# Patient Record
Sex: Female | Born: 1965 | Race: White | Hispanic: No | Smoking: Former smoker
Health system: Southern US, Community
[De-identification: ages and names within clinical notes are randomized; demographics above are authoritative.]

## PROBLEM LIST (undated history)

## (undated) ENCOUNTER — Ambulatory Visit (HOSPITAL_COMMUNITY): Admission: EM | Payer: BC Managed Care – PPO | Source: Home / Self Care

## (undated) DIAGNOSIS — N200 Calculus of kidney: Secondary | ICD-10-CM

## (undated) HISTORY — PX: ABLATION: SHX5711

## (undated) HISTORY — PX: CHOLECYSTECTOMY: SHX55

## (undated) HISTORY — DX: Calculus of kidney: N20.0

## (undated) HISTORY — PX: OTHER SURGICAL HISTORY: SHX169

## (undated) HISTORY — PX: KIDNEY STONE SURGERY: SHX686

---

## 2018-08-07 ENCOUNTER — Other Ambulatory Visit (HOSPITAL_COMMUNITY)
Admission: RE | Admit: 2018-08-07 | Discharge: 2018-08-07 | Disposition: A | Discharge status: Home / Self Care | Payer: BC Managed Care – PPO | Source: Ambulatory Visit | Attending: Obstetrics and Gynecology | Admitting: Obstetrics and Gynecology

## 2018-08-07 ENCOUNTER — Other Ambulatory Visit: Payer: Self-pay

## 2018-08-07 ENCOUNTER — Ambulatory Visit (INDEPENDENT_AMBULATORY_CARE_PROVIDER_SITE_OTHER): Payer: BC Managed Care – PPO | Admitting: Obstetrics and Gynecology

## 2018-08-07 ENCOUNTER — Encounter: Payer: Self-pay | Admitting: Obstetrics and Gynecology

## 2018-08-07 VITALS — BP 110/70 | Ht 65.0 in | Wt 155.6 lb

## 2018-08-07 DIAGNOSIS — Z124 Encounter for screening for malignant neoplasm of cervix: Secondary | ICD-10-CM | POA: Diagnosis present

## 2018-08-07 DIAGNOSIS — N841 Polyp of cervix uteri: Secondary | ICD-10-CM

## 2018-08-07 DIAGNOSIS — N200 Calculus of kidney: Secondary | ICD-10-CM

## 2018-08-07 NOTE — Progress Notes (Signed)
   Patient ID: Amy Cameron, female   DOB: 07-Apr-1965, 53 y.o.   MRN: [id number]  Reason for Consult: Gynecologic Exam (pt says she is here for pap mainly, hasnt had one in 3 yrs)   Referred by Hughie Closs, PA-C  Subjective:     HPI:  Amy Cameron is a 53 y.o. female   Gynecological History  No LMP recorded. Patient has had an ablation.   History of fibroids, polyps, or ovarian cysts? : no  History of PCOS? no Hstory of Endometriosis? no History of abnormal pap smears? no Have you had any sexually transmitted infections in the past? no    History reviewed. No pertinent past medical history. Family History  Problem Relation Age of Onset  . Breast cancer Mother 19       precancerous, removed both breasts  . Diabetes Mother   . Hypertension Mother   . Other Mother        cholesterol  . Kidney cancer Father   . Diabetes Father   . Hypertension Father   . Other Father        cholesterol   Past Surgical History:  Procedure Laterality Date  . ABLATION    . CHOLECYSTECTOMY    . EXTRACORPOREAL SHOCK WAVE LITHOTRIPSY Right 08/10/2018   Procedure: EXTRACORPOREAL SHOCK WAVE LITHOTRIPSY (ESWL);  Surgeon: Billey Co, MD;  Location: [facility] ORS;  Service: Urology;  Laterality: Right;  . KIDNEY STONE SURGERY    . OTHER SURGICAL HISTORY     both shoulders    Short Social History:  Social History   Tobacco Use  . Smoking status: Former Research scientist (life sciences)  . Smokeless tobacco: Never Used  Substance Use Topics  . Alcohol use: Not Currently    Allergies  Allergen Reactions  . Naproxen Nausea And Vomiting and Other (See Comments)    Acute Kidney Injury Acute Kidney Injury   . Sulfa Antibiotics Hives    No current outpatient medications on file.   No current facility-administered medications for this visit.    REVIEW OF SYSTEMS      Objective:  Objective   Vitals:   08/07/18 1429  BP: 110/70  Weight: 155 lb 9.6 oz (70.6 kg)  Height: 5\' 5"  (1.651 m)    Body mass index is 25.89 kg/m.  Physical Exam  Assessment/Plan:    53 yo Small cervical polyp removed Pap smear today Mammogram scheduled for later this week Declines STD screening  [place], [facility] 08/07/2018 4:33 PM

## 2018-08-08 ENCOUNTER — Ambulatory Visit
Admission: RE | Admit: 2018-08-08 | Discharge: 2018-08-08 | Disposition: A | Discharge status: Home / Self Care | Payer: BC Managed Care – PPO | Attending: Urology | Admitting: Urology

## 2018-08-08 ENCOUNTER — Ambulatory Visit
Admission: RE | Admit: 2018-08-08 | Discharge: 2018-08-08 | Disposition: A | Discharge status: Home / Self Care | Payer: BC Managed Care – PPO | Source: Ambulatory Visit | Attending: Urology | Admitting: Urology

## 2018-08-08 ENCOUNTER — Other Ambulatory Visit
Admission: RE | Admit: 2018-08-08 | Discharge: 2018-08-08 | Disposition: A | Discharge status: Home / Self Care | Payer: BC Managed Care – PPO | Source: Home / Self Care | Attending: Urology | Admitting: Urology

## 2018-08-08 ENCOUNTER — Ambulatory Visit (INDEPENDENT_AMBULATORY_CARE_PROVIDER_SITE_OTHER): Payer: BC Managed Care – PPO | Admitting: Urology

## 2018-08-08 ENCOUNTER — Encounter: Payer: Self-pay | Admitting: Urology

## 2018-08-08 VITALS — BP 115/83 | HR 64 | Ht 65.0 in | Wt 155.0 lb

## 2018-08-08 DIAGNOSIS — N200 Calculus of kidney: Secondary | ICD-10-CM

## 2018-08-08 LAB — URINALYSIS, COMPLETE (UACMP) WITH MICROSCOPIC
Bilirubin Urine: NEGATIVE
Glucose, UA: NEGATIVE mg/dL
Hgb urine dipstick: NEGATIVE
Ketones, ur: NEGATIVE mg/dL
Nitrite: NEGATIVE
Protein, ur: NEGATIVE mg/dL
RBC / HPF: NONE SEEN RBC/hpf (ref 0–5)
Specific Gravity, Urine: 1.02 (ref 1.005–1.030)
Squamous Epithelial / HPF: NONE SEEN (ref 0–5)
pH: 8 (ref 5.0–8.0)

## 2018-08-08 NOTE — Patient Instructions (Signed)
1. Start litholyte packets 2-3 daily for stone prevention   Dietary Guidelines to Help Prevent Kidney Stones Kidney stones are deposits of minerals and salts that form inside your kidneys. Your risk of developing kidney stones may be greater depending on your diet, your lifestyle, the medicines you take, and whether you have certain medical conditions. Most people can reduce their chances of developing kidney stones by following the instructions below. Depending on your overall health and the type of kidney stones you tend to develop, your dietitian may give you more specific instructions. What are tips for following this plan? Reading food labels  Choose foods with "no salt added" or "low-salt" labels. Limit your sodium intake to less than 1500 mg per day.  Choose foods with calcium for each meal and snack. Try to eat about 300 mg of calcium at each meal. Foods that contain 200-500 mg of calcium per serving include: ? 8 oz (237 ml) of milk, fortified nondairy milk, and fortified fruit juice. ? 8 oz (237 ml) of kefir, yogurt, and soy yogurt. ? 4 oz (118 ml) of tofu. ? 1 oz of cheese. ? 1 cup (300 g) of dried figs. ? 1 cup (91 g) of cooked broccoli. ? 1-3 oz can of sardines or mackerel.  Most people need 1000 to 1500 mg of calcium each day. Talk to your dietitian about how much calcium is recommended for you. Shopping  Buy plenty of fresh fruits and vegetables. Most people do not need to avoid fruits and vegetables, even if they contain nutrients that may contribute to kidney stones.  When shopping for convenience foods, choose: ? Whole pieces of fruit. ? Premade salads with dressing on the side. ? Low-fat fruit and yogurt smoothies.  Avoid buying frozen meals or prepared deli foods.  Look for foods with live cultures, such as yogurt and kefir. Cooking  Do not add salt to food when cooking. Place a salt shaker on the table and allow each person to add his or her own salt to taste.   Use vegetable protein, such as beans, textured vegetable protein (TVP), or tofu instead of meat in pasta, casseroles, and soups. Meal planning   Eat less salt, if told by your dietitian. To do this: ? Avoid eating processed or premade food. ? Avoid eating fast food.  Eat less animal protein, including cheese, meat, poultry, or fish, if told by your dietitian. To do this: ? Limit the number of times you have meat, poultry, fish, or cheese each week. Eat a diet free of meat at least 2 days a week. ? Eat only one serving each day of meat, poultry, fish, or seafood. ? When you prepare animal protein, cut pieces into small portion sizes. For most meat and fish, one serving is about the size of one deck of cards.  Eat at least 5 servings of fresh fruits and vegetables each day. To do this: ? Keep fruits and vegetables on hand for snacks. ? Eat 1 piece of fruit or a handful of berries with breakfast. ? Have a salad and fruit at lunch. ? Have two kinds of vegetables at dinner.  Limit foods that are high in a substance called oxalate. These include: ? Spinach. ? Rhubarb. ? Beets. ? Potato chips and french fries. ? Nuts.  If you regularly take a diuretic medicine, make sure to eat at least 1-2 fruits or vegetables high in potassium each day. These include: ? Avocado. ? Banana. ? Orange, prune, carrot, or  tomato juice. ? Baked potato. ? Cabbage. ? Beans and split peas. General instructions   Drink enough fluid to keep your urine clear or pale yellow. This is the most important thing you can do.  Talk to your health care provider and dietitian about taking daily supplements. Depending on your health and the cause of your kidney stones, you may be advised: ? Not to take supplements with vitamin C. ? To take a calcium supplement. ? To take a daily probiotic supplement. ? To take other supplements such as magnesium, fish oil, or vitamin B6.  Take all medicines and supplements as told by  your health care provider.  Limit alcohol intake to no more than 1 drink a day for nonpregnant women and 2 drinks a day for men. One drink equals 12 oz of beer, 5 oz of wine, or 1 oz of hard liquor.  Lose weight if told by your health care provider. Work with your dietitian to find strategies and an eating plan that works best for you. What foods are not recommended? Limit your intake of the following foods, or as told by your dietitian. Talk to your dietitian about specific foods you should avoid based on the type of kidney stones and your overall health. Grains Breads. Bagels. Rolls. Baked goods. Salted crackers. Cereal. Pasta. Vegetables Spinach. Rhubarb. Beets. Canned vegetables. Rosita FirePickles. Olives. Meats and other protein foods Nuts. Nut butters. Large portions of meat, poultry, or fish. Salted or cured meats. Deli meats. Hot dogs. Sausages. Dairy Cheese. Beverages Regular soft drinks. Regular vegetable juice. Seasonings and other foods Seasoning blends with salt. Salad dressings. Canned soups. Soy sauce. Ketchup. Barbecue sauce. Canned pasta sauce. Casseroles. Pizza. Lasagna. Frozen meals. Potato chips. JamaicaFrench fries. Summary  You can reduce your risk of kidney stones by making changes to your diet.  The most important thing you can do is drink enough fluid. You should drink enough fluid to keep your urine clear or pale yellow.  Ask your health care provider or dietitian how much protein from animal sources you should eat each day, and also how much salt and calcium you should have each day. This information is not intended to replace advice given to you by your health care provider. Make sure you discuss any questions you have with your health care provider. Document Released: 05/15/2010 Document Revised: 05/10/2018 Document Reviewed: 12/30/2015 Elsevier Patient Education  2020 [employer]

## 2018-08-08 NOTE — Progress Notes (Addendum)
08/08/2018 1:25 PM   Amy Cameron November 04, 1965 [id number]  Referring provider: Nelwyn Salisbury, PA-C [address],  [place] [address]  CC: Recurrent nephrolithiasis  HPI: I saw Amy Cameron in urology clinic today in consultation for recurrent nephrolithiasis from [place], [place].  She is a very healthy 53 year old female who reportedly has an extensive history of nephrolithiasis over the last 8 years with 5 to 10 stones passed per year.  She recently moved to the area from [place].  She has undergone right ureteroscopy, laser lithotripsy, and stent previously for what sounds like an infected 7 mm right ureteral stone that needed to be pre-stented.  She did not tolerate her stent well.  She also was worked up with a 24-hour urine that was reportedly negative, aside from low urine volume.  She most recently passed a stone about 4 to 5 weeks ago, and brought to clinic today.  We will send for analysis.  She has occasional dull right-sided flank pain, but denies any symptoms of acute renal colic today.  She denies any gross hematuria, fevers, or chills.  She is unsure her stone type, but thinks it was calcium oxalate mixed with something else.  No prior bowel surgeries.  There are no aggravating or alleviating factors.  Severity is moderate.  She denies a family history of kidney stones.  There is no recent imaging to review regarding stone burden.   PMH: None   Surgical History: Past Surgical History:  Procedure Laterality Date  . ABLATION    . CHOLECYSTECTOMY    . KIDNEY STONE SURGERY    . OTHER SURGICAL HISTORY     both shoulders    Allergies:  Allergies  Allergen Reactions  . Naproxen Nausea And Vomiting and Other (See Comments)    Acute Kidney Injury Acute Kidney Injury   . Sulfa Antibiotics Hives    Family History: Family History  Problem Relation Age of Onset  . Breast cancer Mother 88       precancerous, removed both breasts  . Diabetes  Mother   . Hypertension Mother   . Other Mother        cholesterol  . Kidney cancer Father   . Diabetes Father   . Hypertension Father   . Other Father        cholesterol    Social History:  reports that she has quit smoking. She has never used smokeless tobacco. She reports previous alcohol use. She reports previous drug use.  ROS: Please see flowsheet from today's date for complete review of systems.  Physical Exam: BP 115/83 (BP Location: Left Arm, Patient Position: Sitting)   Pulse 64   Ht 5\' 5"  (1.651 m)   Wt 155 lb (70.3 kg)   BMI 25.79 kg/m    Constitutional:  Alert and oriented, No acute distress. Cardiovascular: No clubbing, cyanosis, or edema. Respiratory: Normal respiratory effort, no increased work of breathing. GI: Abdomen is soft, nontender, nondistended, no abdominal masses GU: No CVA tenderness Lymph: No cervical or inguinal lymphadenopathy. Skin: No rashes, bruises or suspicious lesions. Neurologic: Grossly intact, no focal deficits, moving all 4 extremities. Psychiatric: Normal mood and affect.  Laboratory Data: Urinalysis today pH 8, rare bacteria, 0 RBCs, 6-10 WBCs  Pertinent Imaging: None to review  Assessment & Plan:   In summary, the patient is a healthy 53 year old female with an extensive history of recurrent nephrolithiasis.  She denies acute symptoms of renal colic today.  We discussed options to  evaluate her stone burden including renal ultrasound, KUB, and CT.  We discussed the risks and benefits of these at length.  We also reviewed possible treatment options including shockwave lithotripsy, ureteroscopy, and PCNL.  We discussed general stone prevention strategies including adequate hydration with goal of producing 2.5 L of urine daily, increasing citric acid intake, increasing calcium intake during high oxalate meals, minimizing animal protein, and decreasing salt intake. Information about dietary recommendations given today.  I also offered  a repeat 24-hour urine test, however she would like to hold off at this time.  -KUB and ultrasound to evaluate stone burden, will call with results -Calcium and PTH today -Stone prevention strategies discussed at length, recommended starting litholyte 2 to 3 packets daily, and increasing urine volume with goal 2.5 L/day -RTC 6 months for symptom check, sooner if acute stone episode  Sondra ComeBrian C Fortino Haag, MD  [facility] [address], [address] [place], [place] [address] [phone]  ADDENDUM: KUB showed 7mm right mid ureteral stone, follow up CT confirmed 7mm right proximal ureteral stone, 1000HU, 12cm SSD, clearly seen on KUB. Punctate left renal stones.   We discussed various treatment options for urolithiasis including observation with or without medical expulsive therapy, shockwave lithotripsy (SWL), ureteroscopy and laser lithotripsy with stent placement, and percutaneous nephrolithotomy.  We discussed that management is based on stone size, location, density, patient co-morbidities, and patient preference.   Stones <605mm in size have a >80% spontaneous passage rate. Data surrounding the use of tamsulosin for medical expulsive therapy is controversial, but meta analyses suggests it is most efficacious for distal stones between 5-1710mm in size. Possible side effects include dizziness/lightheadedness, and retrograde ejaculation.  SWL has a lower stone free rate in a single procedure, but also a lower complication rate compared to ureteroscopy and avoids a stent and associated stent related symptoms. Possible complications include renal hematoma, steinstrasse, and need for additional treatment.  Ureteroscopy with laser lithotripsy and stent placement has a higher stone free rate than SWL in a single procedure, however increased complication rate including possible infection, ureteral injury, bleeding, and stent related morbidity. Common stent related symptoms include  dysuria, urgency/frequency, and flank pain.  She would like to proceed with right SWL tomorrow 7/9 for her right 7mm proximal ureteral stone. She had a stent previously and did not tolerate it well. She understands the slightly lower stone free rate with SWL vs URS.  Legrand RamsBrian Javiel Canepa, MD 08/09/2018

## 2018-08-09 ENCOUNTER — Other Ambulatory Visit: Payer: Self-pay | Admitting: Radiology

## 2018-08-09 ENCOUNTER — Other Ambulatory Visit: Payer: Self-pay | Admitting: Urology

## 2018-08-09 ENCOUNTER — Other Ambulatory Visit: Payer: Self-pay

## 2018-08-09 ENCOUNTER — Ambulatory Visit
Admission: RE | Admit: 2018-08-09 | Discharge: 2018-08-09 | Disposition: A | Discharge status: Home / Self Care | Payer: BC Managed Care – PPO | Source: Ambulatory Visit | Attending: Urology | Admitting: Urology

## 2018-08-09 ENCOUNTER — Telehealth: Payer: Self-pay

## 2018-08-09 DIAGNOSIS — R1011 Right upper quadrant pain: Secondary | ICD-10-CM

## 2018-08-09 DIAGNOSIS — N200 Calculus of kidney: Secondary | ICD-10-CM

## 2018-08-09 LAB — PTH, INTACT AND CALCIUM
Calcium, Total (PTH): 9 mg/dL (ref 8.7–10.2)
PTH: 21 pg/mL (ref 15–65)

## 2018-08-09 NOTE — Telephone Encounter (Signed)
-----   Message from Billey Co, MD sent at 08/09/2018  7:27 AM EDT ----- Please let her know xray suggests a 84mm right sided stone as cause of her pain. This is likely too big to pass on its own. Needs a CT stone protocol asap(ordered stat this AM). We can try to get her to SWL truck Thursday if she is able to get CT today.  Thanks Nickolas Madrid, MD 08/09/2018

## 2018-08-09 NOTE — Telephone Encounter (Signed)
Please schedule order placed thanks

## 2018-08-10 ENCOUNTER — Ambulatory Visit: Payer: BC Managed Care – PPO

## 2018-08-10 ENCOUNTER — Other Ambulatory Visit: Payer: Self-pay

## 2018-08-10 ENCOUNTER — Encounter
Admission: RE | Disposition: A | Discharge status: Home / Self Care | Payer: Self-pay | Source: Home / Self Care | Attending: Urology

## 2018-08-10 ENCOUNTER — Encounter: Payer: Self-pay | Admitting: *Deleted

## 2018-08-10 ENCOUNTER — Ambulatory Visit
Admission: RE | Admit: 2018-08-10 | Discharge: 2018-08-10 | Disposition: A | Discharge status: Home / Self Care | Payer: BC Managed Care – PPO | Attending: Urology | Admitting: Urology

## 2018-08-10 ENCOUNTER — Telehealth: Payer: Self-pay | Admitting: Obstetrics and Gynecology

## 2018-08-10 DIAGNOSIS — Z886 Allergy status to analgesic agent status: Secondary | ICD-10-CM | POA: Diagnosis not present

## 2018-08-10 DIAGNOSIS — Z87891 Personal history of nicotine dependence: Secondary | ICD-10-CM | POA: Diagnosis not present

## 2018-08-10 DIAGNOSIS — N201 Calculus of ureter: Secondary | ICD-10-CM

## 2018-08-10 DIAGNOSIS — N200 Calculus of kidney: Secondary | ICD-10-CM

## 2018-08-10 DIAGNOSIS — Z882 Allergy status to sulfonamides status: Secondary | ICD-10-CM | POA: Diagnosis not present

## 2018-08-10 HISTORY — PX: EXTRACORPOREAL SHOCK WAVE LITHOTRIPSY: SHX1557

## 2018-08-10 LAB — URINE DRUG SCREEN, QUALITATIVE (ARMC ONLY)
Amphetamines, Ur Screen: NOT DETECTED
Barbiturates, Ur Screen: NOT DETECTED
Benzodiazepine, Ur Scrn: NOT DETECTED
Cannabinoid 50 Ng, Ur ~~LOC~~: NOT DETECTED
Cocaine Metabolite,Ur ~~LOC~~: NOT DETECTED
MDMA (Ecstasy)Ur Screen: NOT DETECTED
Methadone Scn, Ur: NOT DETECTED
Opiate, Ur Screen: NOT DETECTED
Phencyclidine (PCP) Ur S: NOT DETECTED
Tricyclic, Ur Screen: NOT DETECTED

## 2018-08-10 LAB — POCT PREGNANCY, URINE: Preg Test, Ur: NEGATIVE

## 2018-08-10 SURGERY — LITHOTRIPSY, ESWL
Anesthesia: Moderate Sedation | Laterality: Right

## 2018-08-10 MED ORDER — CIPROFLOXACIN HCL 500 MG PO TABS
ORAL_TABLET | ORAL | Status: AC
  Administered 2018-08-10: 500 mg via ORAL
  Filled 2018-08-10: qty 1

## 2018-08-10 MED ORDER — DIAZEPAM 5 MG PO TABS
ORAL_TABLET | ORAL | Status: AC
  Administered 2018-08-10: 10 mg via ORAL
  Filled 2018-08-10: qty 2

## 2018-08-10 MED ORDER — ONDANSETRON HCL 4 MG/2ML IJ SOLN
4.0000 mg | Freq: Once | INTRAMUSCULAR | Status: AC | PRN
  Administered 2018-08-10: 15:00:00 4 mg via INTRAVENOUS

## 2018-08-10 MED ORDER — DIPHENHYDRAMINE HCL 25 MG PO CAPS
25.0000 mg | ORAL_CAPSULE | ORAL | Status: AC
  Administered 2018-08-10: 15:00:00 25 mg via ORAL

## 2018-08-10 MED ORDER — ONDANSETRON HCL 4 MG/2ML IJ SOLN
INTRAMUSCULAR | Status: AC
  Administered 2018-08-10: 4 mg via INTRAVENOUS
  Filled 2018-08-10: qty 2

## 2018-08-10 MED ORDER — SODIUM CHLORIDE 0.9 % IV SOLN
INTRAVENOUS | Status: DC
  Administered 2018-08-10: 15:00:00 via INTRAVENOUS

## 2018-08-10 MED ORDER — HYDROCODONE-ACETAMINOPHEN 5-325 MG PO TABS: 1.0000 | ORAL_TABLET | ORAL | 0 refills | Status: AC | PRN

## 2018-08-10 MED ORDER — DIPHENHYDRAMINE HCL 25 MG PO CAPS
ORAL_CAPSULE | ORAL | Status: AC
  Administered 2018-08-10: 15:00:00 25 mg via ORAL
  Filled 2018-08-10: qty 1

## 2018-08-10 MED ORDER — ONDANSETRON HCL 2 MG/ML IV SOLN: 4.0000 mg | Freq: Once | INTRAVENOUS | Status: DC | PRN

## 2018-08-10 MED ORDER — TAMSULOSIN HCL 0.4 MG PO CAPS: 0.4000 mg | ORAL_CAPSULE | Freq: Every day | ORAL | 0 refills | Status: DC

## 2018-08-10 MED ORDER — CIPROFLOXACIN HCL 500 MG PO TABS
500.0000 mg | ORAL_TABLET | ORAL | Status: AC
  Administered 2018-08-10: 500 mg via ORAL

## 2018-08-10 MED ORDER — DIAZEPAM 5 MG PO TABS
10.0000 mg | ORAL_TABLET | ORAL | Status: AC
  Administered 2018-08-10: 15:00:00 10 mg via ORAL

## 2018-08-10 NOTE — Telephone Encounter (Signed)
She had a normal benign cervical polyp, pap smear is still pending, they have been taking 7 days to come back usually.

## 2018-08-10 NOTE — H&P (Signed)
UROLOGY H&P UPDATE  Agree with prior H&P dated 08/08/18. Healthy 53 yo F with 2-3 months of right sided flank pain, and CT yesterday with 19mm right proximal ureteral stone. She has not tolerated stents well in the past, and elected for SWL.  Cardiac: RRR Lungs: CTA bilaterally  Laterality: Right Procedure: RIGHT SWL  Urine: 7/11: urinalysis rare bacteria, 6-10 WBCs, nitrite negative, 0 RBCs  Informed consent obtained, we specifically discussed the risks of bleeding, infection, post-operative pain, need for additional procedures, steinstrasse.  Amy Co, MD 08/10/2018

## 2018-08-10 NOTE — Discharge Instructions (Signed)
AMBULATORY SURGERY  °DISCHARGE INSTRUCTIONS ° ° °1) The drugs that you were given will stay in your system until tomorrow so for the next 24 hours you should not: ° °A) Drive an automobile °B) Make any legal decisions °C) Drink any alcoholic beverage ° ° °2) You may resume regular meals tomorrow.  Today it is better to start with liquids and gradually work up to solid foods. ° °You may eat anything you prefer, but it is better to start with liquids, then soup and crackers, and gradually work up to solid foods. ° ° °3) Please notify your doctor immediately if you have any unusual bleeding, trouble breathing, redness and pain at the surgery site, drainage, fever, or pain not relieved by medication. ° ° ° °4) Additional Instructions: ° ° ° ° ° ° ° °Please contact your physician with any problems or Same Day Surgery at [phone], Monday through Friday 6 am to 4 pm, or [place] [facility] [place] Main number at [phone]. °

## 2018-08-10 NOTE — Telephone Encounter (Signed)
Patient is calling for labs results. Please advise. 

## 2018-08-11 ENCOUNTER — Other Ambulatory Visit: Payer: Self-pay | Admitting: Urology

## 2018-08-11 ENCOUNTER — Encounter: Payer: Self-pay | Admitting: Urology

## 2018-08-14 LAB — CYTOLOGY - PAP: HPV: NOT DETECTED

## 2018-08-14 NOTE — Telephone Encounter (Signed)
There is an order in for this patient to have a RUS when they called to schedule it she declined it and I called her and she said she would do one or the other, either the KUB you ordered or the RUS but not both. Which one does she need?   Sharyn Lull

## 2018-08-14 NOTE — Telephone Encounter (Signed)
She does not need the renal US anymore as she had a CT. She needs the KUB for standard SWL follow up, thanks  Nickolas Madrid, MD 08/14/2018

## 2018-08-18 NOTE — Progress Notes (Signed)
Called and discussed. Patient desires to repeat in 1 year.

## 2018-08-22 ENCOUNTER — Encounter: Payer: Self-pay | Admitting: Urology

## 2018-08-22 ENCOUNTER — Ambulatory Visit
Admission: RE | Admit: 2018-08-22 | Discharge: 2018-08-22 | Disposition: A | Discharge status: Home / Self Care | Payer: BC Managed Care – PPO | Attending: Urology | Admitting: Urology

## 2018-08-22 ENCOUNTER — Ambulatory Visit
Admission: RE | Admit: 2018-08-22 | Discharge: 2018-08-22 | Disposition: A | Discharge status: Home / Self Care | Payer: BC Managed Care – PPO | Source: Ambulatory Visit | Attending: Urology | Admitting: Urology

## 2018-08-22 ENCOUNTER — Ambulatory Visit (INDEPENDENT_AMBULATORY_CARE_PROVIDER_SITE_OTHER): Payer: BC Managed Care – PPO | Admitting: Urology

## 2018-08-22 ENCOUNTER — Other Ambulatory Visit: Payer: Self-pay

## 2018-08-22 VITALS — BP 122/68 | HR 65 | Ht 65.0 in | Wt 150.0 lb

## 2018-08-22 DIAGNOSIS — N200 Calculus of kidney: Secondary | ICD-10-CM

## 2018-08-22 NOTE — Progress Notes (Signed)
   08/22/2018 1:36 PM   Amy Cameron 03-07-1965 [id number]  Reason for visit: Follow up right SWL  HPI: I saw [name] in urology clinic for follow-up of nephrolithiasis.  She is a 53 year old healthy female with extensive recurrent nephrolithiasis with 5 to 10 stones passed per year.  She underwent uncomplicated right shockwave lithotripsy on 08/10/2018 for an 8 mm mid ureteral stone with excellent results.  Her right-sided flank pain has resolved.  She passed a number of small stone fragments.  Her KUB today shows no residual ureteral fragments.  Her stone burden currently includes only punctate left-sided renal stones.   Physical Exam: BP 122/68   Pulse 65   Ht 5\' 5"  (1.651 m)   Wt 150 lb (68 kg)   BMI 24.96 kg/m    Laboratory Data: Stone analysis Ca Oxalate 80%, CaP 20%  Pertinent Imaging: I have personally reviewed the KUB today, no residual right ureteral fragments  Assessment & Plan:   53 year old female with recurrent calcium oxalate nephrolithiasis with 5 to 10 stone episodes per year, status post successful right shockwave lithotripsy on 08/10/2018.   We discussed general stone prevention strategies including adequate hydration with goal of producing 2.5 L of urine daily, increasing citric acid intake, increasing calcium intake during high oxalate meals, minimizing animal protein, and decreasing salt intake. Information about dietary recommendations given today. I recommended starting litholyte packets 2-3x daily.  RTC 1 year with KUB  A total of 10 minutes were spent face-to-face with the patient, greater than 50% was spent in patient education, counseling, and coordination of care regarding nephrolithiasis and stone prevention.   Billey Co, [facility] [address], [place],  [address] [phone]

## 2018-08-22 NOTE — Patient Instructions (Signed)
Start taking litholyte packets 2-3x daily mixed with water or any liquid to prevent recurrent kidney stones. These are non-prescription and can be purchased at [email or web address] or [email or web address]   Dietary Guidelines to Help Prevent Kidney Stones Kidney stones are deposits of minerals and salts that form inside your kidneys. Your risk of developing kidney stones may be greater depending on your diet, your lifestyle, the medicines you take, and whether you have certain medical conditions. Most people can reduce their chances of developing kidney stones by following the instructions below. Depending on your overall health and the type of kidney stones you tend to develop, your dietitian may give you more specific instructions. What are tips for following this plan? Reading food labels  Choose foods with "no salt added" or "low-salt" labels. Limit your sodium intake to less than 1500 mg per day.  Choose foods with calcium for each meal and snack. Try to eat about 300 mg of calcium at each meal. Foods that contain 200-500 mg of calcium per serving include: ? 8 oz (237 ml) of milk, fortified nondairy milk, and fortified fruit juice. ? 8 oz (237 ml) of kefir, yogurt, and soy yogurt. ? 4 oz (118 ml) of tofu. ? 1 oz of cheese. ? 1 cup (300 g) of dried figs. ? 1 cup (91 g) of cooked broccoli. ? 1-3 oz can of sardines or mackerel.  Most people need 1000 to 1500 mg of calcium each day. Talk to your dietitian about how much calcium is recommended for you. Shopping  Buy plenty of fresh fruits and vegetables. Most people do not need to avoid fruits and vegetables, even if they contain nutrients that may contribute to kidney stones.  When shopping for convenience foods, choose: ? Whole pieces of fruit. ? Premade salads with dressing on the side. ? Low-fat fruit and yogurt smoothies.  Avoid buying frozen meals or prepared deli foods.  Look for foods with live cultures, such as yogurt and kefir. Cooking  Do  not add salt to food when cooking. Place a salt shaker on the table and allow each person to add his or her own salt to taste.  Use vegetable protein, such as beans, textured vegetable protein (TVP), or tofu instead of meat in pasta, casseroles, and soups. Meal planning   Eat less salt, if told by your dietitian. To do this: ? Avoid eating processed or premade food. ? Avoid eating fast food.  Eat less animal protein, including cheese, meat, poultry, or fish, if told by your dietitian. To do this: ? Limit the number of times you have meat, poultry, fish, or cheese each week. Eat a diet free of meat at least 2 days a week. ? Eat only one serving each day of meat, poultry, fish, or seafood. ? When you prepare animal protein, cut pieces into small portion sizes. For most meat and fish, one serving is about the size of one deck of cards.  Eat at least 5 servings of fresh fruits and vegetables each day. To do this: ? Keep fruits and vegetables on hand for snacks. ? Eat 1 piece of fruit or a handful of berries with breakfast. ? Have a salad and fruit at lunch. ? Have two kinds of vegetables at dinner.  Limit foods that are high in a substance called oxalate. These include: ? Spinach. ? Rhubarb. ? Beets. ? Potato chips and french fries. ? Nuts.  If you regularly take a diuretic medicine, make sure to eat at least 1-2  fruits or vegetables high in potassium each day. These include: ? Avocado. ? Banana. ? Orange, prune, carrot, or tomato juice. ? Baked potato. ? Cabbage. ? Beans and split peas. General instructions   Drink enough fluid to keep your urine clear or pale yellow. This is the most important thing you can do.  Talk to your health care provider and dietitian about taking daily supplements. Depending on your health and the cause of your kidney stones, you may be advised: ? Not to take supplements with vitamin C. ? To take a calcium supplement. ? To take a daily probiotic  supplement. ? To take other supplements such as magnesium, fish oil, or vitamin B6.  Take all medicines and supplements as told by your health care provider.  Limit alcohol intake to no more than 1 drink a day for nonpregnant women and 2 drinks a day for men. One drink equals 12 oz of beer, 5 oz of wine, or 1 oz of hard liquor.  Lose weight if told by your health care provider. Work with your dietitian to find strategies and an eating plan that works best for you. What foods are not recommended? Limit your intake of the following foods, or as told by your dietitian. Talk to your dietitian about specific foods you should avoid based on the type of kidney stones and your overall health. Grains Breads. Bagels. Rolls. Baked goods. Salted crackers. Cereal. Pasta. Vegetables Spinach. Rhubarb. Beets. Canned vegetables. Rosita FirePickles. Olives. Meats and other protein foods Nuts. Nut butters. Large portions of meat, poultry, or fish. Salted or cured meats. Deli meats. Hot dogs. Sausages. Dairy Cheese. Beverages Regular soft drinks. Regular vegetable juice. Seasonings and other foods Seasoning blends with salt. Salad dressings. Canned soups. Soy sauce. Ketchup. Barbecue sauce. Canned pasta sauce. Casseroles. Pizza. Lasagna. Frozen meals. Potato chips. [place] fries. Summary  You can reduce your risk of kidney stones by making changes to your diet.  The most important thing you can do is drink enough fluid. You should drink enough fluid to keep your urine clear or pale yellow.  Ask your health care provider or dietitian how much protein from animal sources you should eat each day, and also how much salt and calcium you should have each day. This information is not intended to replace advice given to you by your health care provider. Make sure you discuss any questions you have with your health care provider. Document Released: 05/15/2010 Document Revised: 05/10/2018 Document Reviewed: 12/30/2015  Elsevier Patient Education  2020 [employer]

## 2018-08-25 ENCOUNTER — Ambulatory Visit: Payer: BC Managed Care – PPO | Admitting: Urology

## 2019-02-13 ENCOUNTER — Ambulatory Visit: Payer: BC Managed Care – PPO | Admitting: Urology

## 2019-08-28 ENCOUNTER — Encounter: Payer: Self-pay | Admitting: Urology

## 2019-08-28 ENCOUNTER — Ambulatory Visit
Admission: RE | Admit: 2019-08-28 | Discharge: 2019-08-28 | Disposition: A | Discharge status: Home / Self Care | Payer: BC Managed Care – PPO | Attending: Urology | Admitting: Urology

## 2019-08-28 ENCOUNTER — Ambulatory Visit
Admission: RE | Admit: 2019-08-28 | Discharge: 2019-08-28 | Disposition: A | Discharge status: Home / Self Care | Payer: BC Managed Care – PPO | Source: Ambulatory Visit | Attending: Urology | Admitting: Urology

## 2019-08-28 ENCOUNTER — Ambulatory Visit (INDEPENDENT_AMBULATORY_CARE_PROVIDER_SITE_OTHER): Payer: BC Managed Care – PPO | Admitting: Urology

## 2019-08-28 ENCOUNTER — Other Ambulatory Visit: Payer: Self-pay

## 2019-08-28 VITALS — BP 93/66 | HR 80 | Ht 65.0 in | Wt 166.0 lb

## 2019-08-28 DIAGNOSIS — N200 Calculus of kidney: Secondary | ICD-10-CM

## 2019-08-28 NOTE — Patient Instructions (Signed)
Dietary Guidelines to Help Prevent Kidney Stones Kidney stones are deposits of minerals and salts that form inside your kidneys. Your risk of developing kidney stones may be greater depending on your diet, your lifestyle, the medicines you take, and whether you have certain medical conditions. Most people can reduce their chances of developing kidney stones by following the instructions below. Depending on your overall health and the type of kidney stones you tend to develop, your dietitian may give you more specific instructions. What are tips for following this plan? Reading food labels  Choose foods with "no salt added" or "low-salt" labels. Limit your sodium intake to less than 1500 mg per day.  Choose foods with calcium for each meal and snack. Try to eat about 300 mg of calcium at each meal. Foods that contain 200-500 mg of calcium per serving include: ? 8 oz (237 ml) of milk, fortified nondairy milk, and fortified fruit juice. ? 8 oz (237 ml) of kefir, yogurt, and soy yogurt. ? 4 oz (118 ml) of tofu. ? 1 oz of cheese. ? 1 cup (300 g) of dried figs. ? 1 cup (91 g) of cooked broccoli. ? 1-3 oz can of sardines or mackerel.  Most people need 1000 to 1500 mg of calcium each day. Talk to your dietitian about how much calcium is recommended for you. Shopping  Buy plenty of fresh fruits and vegetables. Most people do not need to avoid fruits and vegetables, even if they contain nutrients that may contribute to kidney stones.  When shopping for convenience foods, choose: ? Whole pieces of fruit. ? Premade salads with dressing on the side. ? Low-fat fruit and yogurt smoothies.  Avoid buying frozen meals or prepared deli foods.  Look for foods with live cultures, such as yogurt and kefir. Cooking  Do not add salt to food when cooking. Place a salt shaker on the table and allow each person to add his or her own salt to taste.  Use vegetable protein, such as beans, textured vegetable  protein (TVP), or tofu instead of meat in pasta, casseroles, and soups. Meal planning   Eat less salt, if told by your dietitian. To do this: ? Avoid eating processed or premade food. ? Avoid eating fast food.  Eat less animal protein, including cheese, meat, poultry, or fish, if told by your dietitian. To do this: ? Limit the number of times you have meat, poultry, fish, or cheese each week. Eat a diet free of meat at least 2 days a week. ? Eat only one serving each day of meat, poultry, fish, or seafood. ? When you prepare animal protein, cut pieces into small portion sizes. For most meat and fish, one serving is about the size of one deck of cards.  Eat at least 5 servings of fresh fruits and vegetables each day. To do this: ? Keep fruits and vegetables on hand for snacks. ? Eat 1 piece of fruit or a handful of berries with breakfast. ? Have a salad and fruit at lunch. ? Have two kinds of vegetables at dinner.  Limit foods that are high in a substance called oxalate. These include: ? Spinach. ? Rhubarb. ? Beets. ? Potato chips and french fries. ? Nuts.  If you regularly take a diuretic medicine, make sure to eat at least 1-2 fruits or vegetables high in potassium each day. These include: ? Avocado. ? Banana. ? Orange, prune, carrot, or tomato juice. ? Baked potato. ? Cabbage. ? Beans and split   peas. General instructions   Drink enough fluid to keep your urine clear or pale yellow. This is the most important thing you can do.  Talk to your health care provider and dietitian about taking daily supplements. Depending on your health and the cause of your kidney stones, you may be advised: ? Not to take supplements with vitamin C. ? To take a calcium supplement. ? To take a daily probiotic supplement. ? To take other supplements such as magnesium, fish oil, or vitamin B6.  Take all medicines and supplements as told by your health care provider.  Limit alcohol intake to no  more than 1 drink a day for nonpregnant women and 2 drinks a day for men. One drink equals 12 oz of beer, 5 oz of wine, or 1 oz of hard liquor.  Lose weight if told by your health care provider. Work with your dietitian to find strategies and an eating plan that works best for you. What foods are not recommended? Limit your intake of the following foods, or as told by your dietitian. Talk to your dietitian about specific foods you should avoid based on the type of kidney stones and your overall health. Grains Breads. Bagels. Rolls. Baked goods. Salted crackers. Cereal. Pasta. Vegetables Spinach. Rhubarb. Beets. Canned vegetables. Pickles. Olives. Meats and other protein foods Nuts. Nut butters. Large portions of meat, poultry, or fish. Salted or cured meats. Deli meats. Hot dogs. Sausages. Dairy Cheese. Beverages Regular soft drinks. Regular vegetable juice. Seasonings and other foods Seasoning blends with salt. Salad dressings. Canned soups. Soy sauce. Ketchup. Barbecue sauce. Canned pasta sauce. Casseroles. Pizza. Lasagna. Frozen meals. Potato chips. French fries. Summary  You can reduce your risk of kidney stones by making changes to your diet.  The most important thing you can do is drink enough fluid. You should drink enough fluid to keep your urine clear or pale yellow.  Ask your health care provider or dietitian how much protein from animal sources you should eat each day, and also how much salt and calcium you should have each day. This information is not intended to replace advice given to you by your health care provider. Make sure you discuss any questions you have with your health care provider. Document Revised: 05/10/2018 Document Reviewed: 12/30/2015 Elsevier Patient Education  2020 Elsevier Inc.  

## 2019-08-28 NOTE — Progress Notes (Signed)
   08/28/2019 3:21 PM   Amy Cameron 1965-02-03 [id number]  Reason for visit: Follow up nephrolithiasis  HPI: I saw [name] back in urology clinic for follow-up of nephrolithiasis.  She is a 54 year old female with history of recurrent stone disease with reportedly 5 to 10 stone episodes per year, who underwent successful right shockwave lithotripsy in July 2020 with no residual stones on post procedure KUB.  Stone type has been 80% calcium oxalate, 20% calcium phosphate.  She denies any stone episodes over the last year.  I had recommended litholite packets previously, but she could never remember to take these.  She is working on drinking lots of fluids.  I personally reviewed her KUB today and do not appreciate any radiopaque stones.  We discussed general stone prevention strategies including adequate hydration with goal of producing 2.5 L of urine daily, increasing citric acid intake, increasing calcium intake during high oxalate meals, minimizing animal protein, and decreasing salt intake. Information about dietary recommendations given today.   She would like to follow-up on an as-needed basis, we discussed return precautions at length  Sondra Come, MD  [facility] [address], [address] [place], [place] [address] [phone]

## 2020-01-09 DIAGNOSIS — S52022A Displaced fracture of olecranon process without intraarticular extension of left ulna, initial encounter for closed fracture: Secondary | ICD-10-CM | POA: Insufficient documentation

## 2020-01-17 DIAGNOSIS — R768 Other specified abnormal immunological findings in serum: Secondary | ICD-10-CM | POA: Insufficient documentation

## 2020-06-16 ENCOUNTER — Other Ambulatory Visit: Payer: Self-pay

## 2020-06-16 DIAGNOSIS — N200 Calculus of kidney: Secondary | ICD-10-CM

## 2020-06-17 ENCOUNTER — Ambulatory Visit (INDEPENDENT_AMBULATORY_CARE_PROVIDER_SITE_OTHER): Payer: BC Managed Care – PPO | Admitting: Urology

## 2020-06-17 ENCOUNTER — Ambulatory Visit
Admission: RE | Admit: 2020-06-17 | Discharge: 2020-06-17 | Disposition: A | Discharge status: Home / Self Care | Payer: BC Managed Care – PPO | Source: Ambulatory Visit | Attending: Urology | Admitting: Urology

## 2020-06-17 ENCOUNTER — Other Ambulatory Visit: Payer: Self-pay

## 2020-06-17 ENCOUNTER — Encounter: Payer: Self-pay | Admitting: Urology

## 2020-06-17 ENCOUNTER — Ambulatory Visit
Admission: RE | Admit: 2020-06-17 | Discharge: 2020-06-17 | Disposition: A | Discharge status: Home / Self Care | Payer: BC Managed Care – PPO | Attending: Urology | Admitting: Urology

## 2020-06-17 VITALS — BP 134/82 | Ht 65.0 in | Wt 160.6 lb

## 2020-06-17 DIAGNOSIS — R87619 Unspecified abnormal cytological findings in specimens from cervix uteri: Secondary | ICD-10-CM | POA: Insufficient documentation

## 2020-06-17 DIAGNOSIS — N200 Calculus of kidney: Secondary | ICD-10-CM | POA: Diagnosis present

## 2020-06-17 NOTE — Progress Notes (Signed)
   06/17/2020 1:04 PM   Amy Cameron 1965-08-07 [id number]  Reason for visit: Nephrolithiasis  HPI: 55 year old female with recurrent calcium oxalate nephrolithiasis who recently passed 2 stones on the right side.  She brought these with her to clinic.  They are both about 2 to 3 mm each.  She is not having any pain at this time.  She does not had any recent imaging.  She underwent a 24-hour urine previously that showed low urine volume.  We discussed general stone prevention strategies including adequate hydration with goal of producing 2.5 L of urine daily, increasing citric acid intake, increasing calcium intake during high oxalate meals, minimizing animal protein, and decreasing salt intake. Information about dietary recommendations given today.   I offered her another repeat 24-hour urine test but she deferred.  She was amenable to a KUB today to evaluate for any residual stone burden, and we will call with those KUB results.   Sondra Come, MD  [facility] [address] [place], [place] [address] [phone]

## 2020-06-17 NOTE — Patient Instructions (Addendum)
Dietary Guidelines to Help Prevent Kidney Stones Kidney stones are deposits of minerals and salts that form inside your kidneys. Your risk of developing kidney stones may be greater depending on your diet, your lifestyle, the medicines you take, and whether you have certain medical conditions. Most people can lower their chances of developing kidney stones by following the instructions below. Your dietitian may give you more specific instructions depending on your overall health and the type of kidney stones you tend to develop. What are tips for following this plan? Reading food labels  Choose foods with "no salt added" or "low-salt" labels. Limit your salt (sodium) intake to less than 1,500 mg a day.  Choose foods with calcium for each meal and snack. Try to eat about 300 mg of calcium at each meal. Foods that contain 200-500 mg of calcium a serving include: ? 8 oz (237 mL) of milk, calcium-fortifiednon-dairy milk, and calcium-fortifiedfruit juice. Calcium-fortified means that calcium has been added to these drinks. ? 8 oz (237 mL) of kefir, yogurt, and soy yogurt. ? 4 oz (114 g) of tofu. ? 1 oz (28 g) of cheese. ? 1 cup (150 g) of dried figs. ? 1 cup (91 g) of cooked broccoli. ? One 3 oz (85 g) can of sardines or mackerel. Most people need 1,000-1,500 mg of calcium a day. Talk to your dietitian about how much calcium is recommended for you.   Shopping  Buy plenty of fresh fruits and vegetables. Most people do not need to avoid fruits and vegetables, even if these foods contain nutrients that may contribute to kidney stones.  When shopping for convenience foods, choose: ? Whole pieces of fruit. ? Pre-made salads with dressing on the side. ? Low-fat fruit and yogurt smoothies.  Avoid buying frozen meals or prepared deli foods. These can be high in sodium.  Look for foods with live cultures, such as yogurt and kefir.  Choose high-fiber grains, such as whole-wheat breads, oat bran, and  wheat cereals. Cooking  Do not add salt to food when cooking. Place a salt shaker on the table and allow each person to add his or her own salt to taste.  Use vegetable protein, such as beans, textured vegetable protein (TVP), or tofu, instead of meat in pasta, casseroles, and soups. Meal planning  Eat less salt, if told by your dietitian. To do this: ? Avoid eating processed or pre-made food. ? Avoid eating fast food.  Eat less animal protein, including cheese, meat, poultry, or fish, if told by your dietitian. To do this: ? Limit the number of times you have meat, poultry, fish, or cheese each week. Eat a diet free of meat at least 2 days a week. ? Eat only one serving each day of meat, poultry, fish, or seafood. ? When you prepare animal protein, cut pieces into small portion sizes. For most meat and fish, one serving is about the size of the palm of your hand.  Eat at least five servings of fresh fruits and vegetables each day. To do this: ? Keep fruits and vegetables on hand for snacks. ? Eat one piece of fruit or a handful of berries with breakfast. ? Have a salad and fruit at lunch. ? Have two kinds of vegetables at dinner.  Limit foods that are high in a substance called oxalate. These include: ? Spinach (cooked), rhubarb, beets, sweet potatoes, and Swiss chard. ? Peanuts. ? Potato chips, french fries, and baked potatoes with skin on. ? Nuts and   nut products. ? Chocolate.  If you regularly take a diuretic medicine, make sure to eat at least 1 or 2 servings of fruits or vegetables that are high in potassium each day. These include: ? Avocado. ? Banana. ? Orange, prune, carrot, or tomato juice. ? Baked potato. ? Cabbage. ? Beans and split peas. Lifestyle  Drink enough fluid to keep your urine pale yellow. This is the most important thing you can do. Spread your fluid intake throughout the day.  If you drink alcohol: ? Limit how much you use to:  0-1 drink a day for  women who are not pregnant.  0-2 drinks a day for men. ? Be aware of how much alcohol is in your drink. In the [place] one drink equals one 12 oz bottle of beer (355 mL), one 5 oz glass of wine (148 mL), or one 1 oz glass of hard liquor (44 mL).  Lose weight if told by your health care provider. Work with your dietitian to find an eating plan and weight loss strategies that work best for you.   General information  Talk to your health care provider and dietitian about taking daily supplements. You may be told the following depending on your health and the cause of your kidney stones: ? Not to take supplements with vitamin C. ? To take a calcium supplement. ? To take a daily probiotic supplement. ? To take other supplements such as magnesium, fish oil, or vitamin B6.  Take over-the-counter and prescription medicines only as told by your health care provider. These include supplements. What foods should I limit? Limit your intake of the following foods, or eat them as told by your dietitian. Vegetables Spinach. Rhubarb. Beets. Canned vegetables. Pickles. Olives. Baked potatoes with skin. Grains Wheat bran. Baked goods. Salted crackers. Cereals high in sugar. Meats and other proteins Nuts. Nut butters. Large portions of meat, poultry, or fish. Salted, precooked, or cured meats, such as sausages, meat loaves, and hot dogs. Dairy Cheese. Beverages Regular soft drinks. Regular vegetable juice. Seasonings and condiments Seasoning blends with salt. Salad dressings. Soy sauce. Ketchup. Barbecue sauce. Other foods Canned soups. Canned pasta sauce. Casseroles. Pizza. Lasagna. Frozen meals. Potato chips. French fries. The items listed above may not be a complete list of foods and beverages you should limit. Contact a dietitian for more information. What foods should I avoid? Talk to your dietitian about specific foods you should avoid based on the type of kidney stones you have and your overall  health. Fruits Grapefruit. The item listed above may not be a complete list of foods and beverages you should avoid. Contact a dietitian for more information. Summary  Kidney stones are deposits of minerals and salts that form inside your kidneys.  You can lower your risk of kidney stones by making changes to your diet.  The most important thing you can do is drink enough fluid. Drink enough fluid to keep your urine pale yellow.  Talk to your dietitian about how much calcium you should have each day, and eat less salt and animal protein as told by your dietitian. This information is not intended to replace advice given to you by your health care provider. Make sure you discuss any questions you have with your health care provider. Document Revised: 01/11/2019 Document Reviewed: 01/11/2019 Elsevier Patient Education  2021 Elsevier Inc.  

## 2020-06-19 ENCOUNTER — Telehealth: Payer: Self-pay

## 2020-06-19 NOTE — Telephone Encounter (Signed)
-----   Message from Sondra Come, MD sent at 06/19/2020  3:17 PM EDT ----- No ureteral stone seen on KUB.  Possible small stone in the right kidney, and some very tiny ones in the left side, no large stones.  Continue prevention strategies as discussed in clinic  Legrand Rams, MD 06/19/2020

## 2020-10-11 IMAGING — CR ABDOMEN - 1 VIEW
1 series · 2 of 2 positions shown · non-contrast
Comparison: None.

CLINICAL DATA: Right abdominal pain for 2-3 months, initial
encounter

EXAM:
ABDOMEN - 1 VIEW

[Series 1: dg abd 1 view · 0.14mm/px · 2 of 2 slices shown]
[im 1/2]
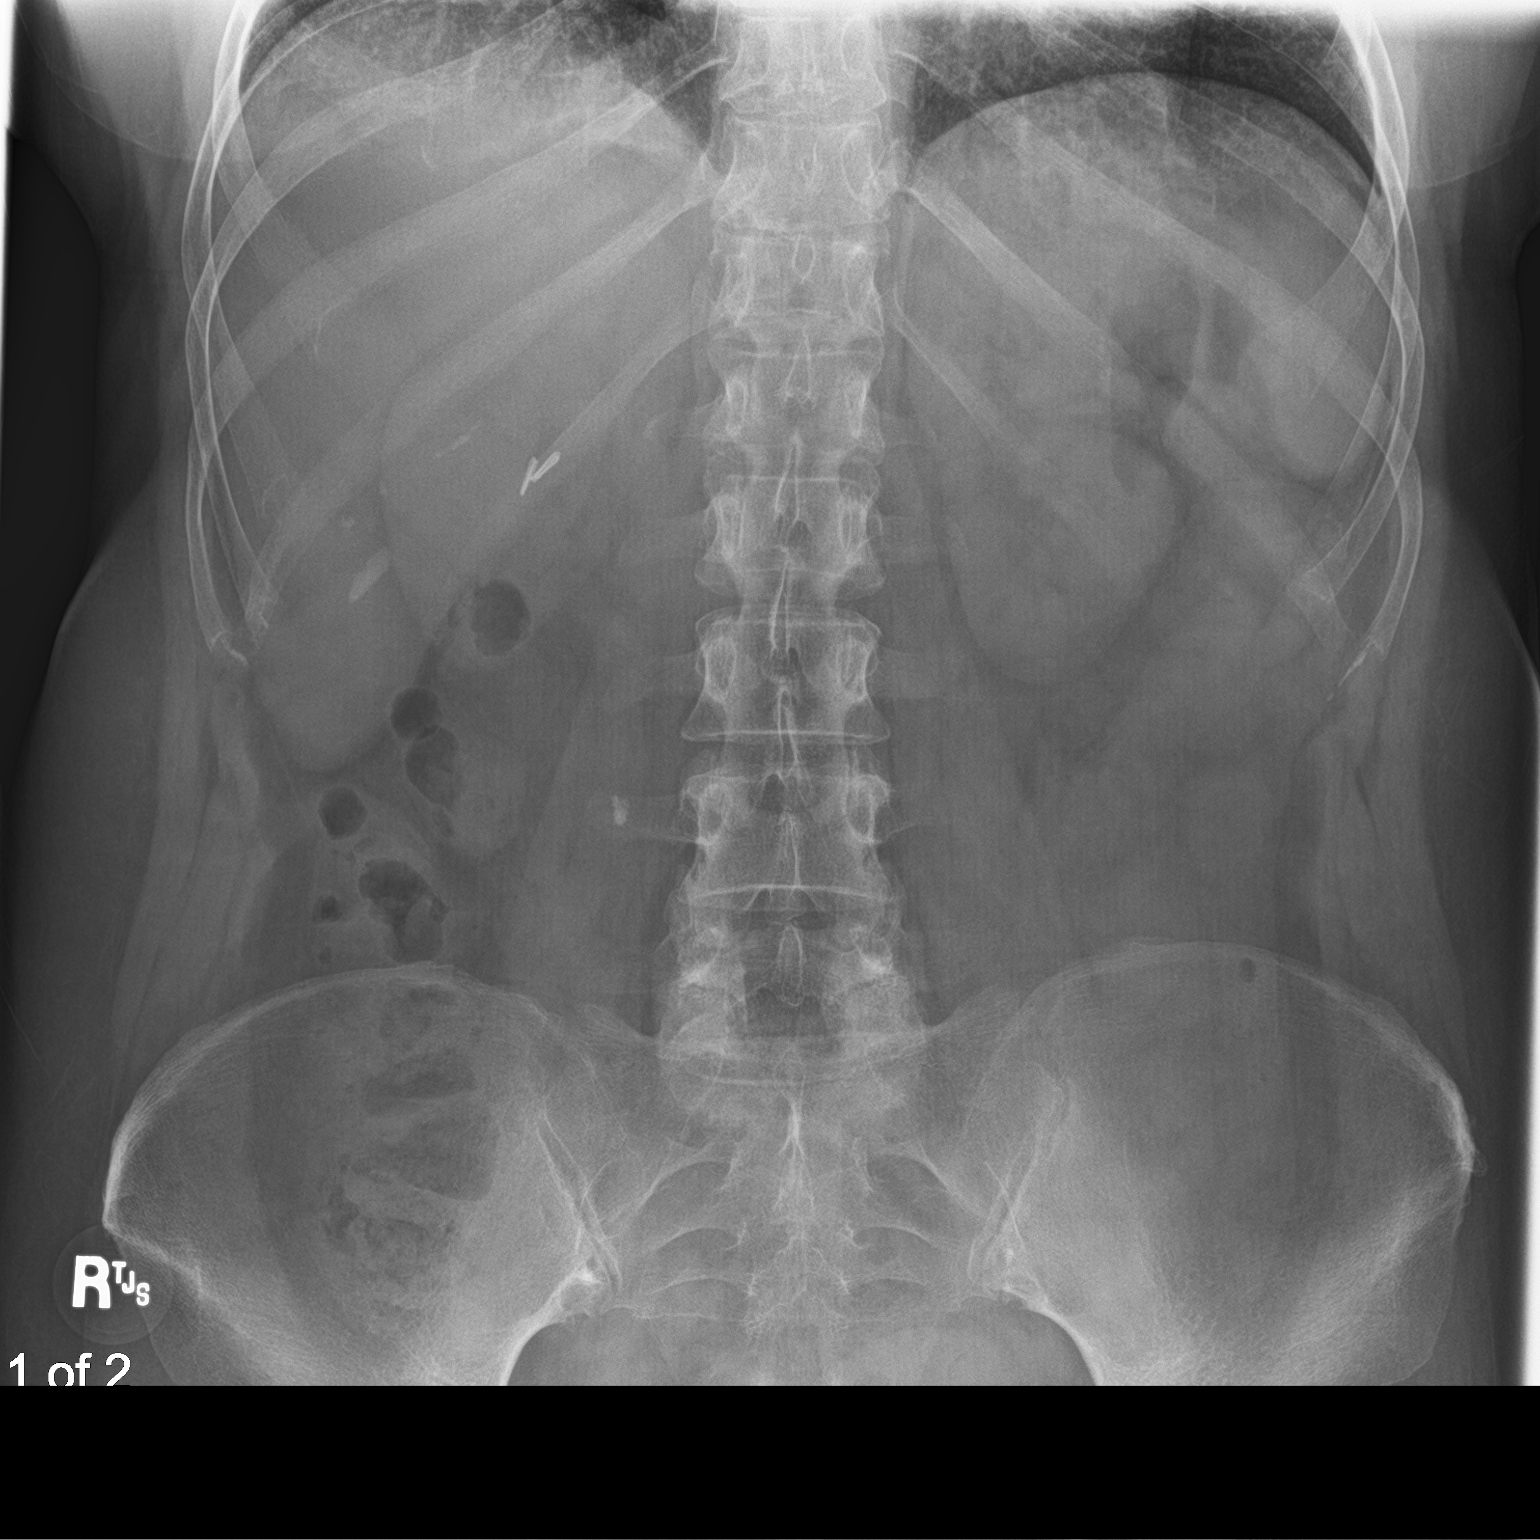
[im 2/2]
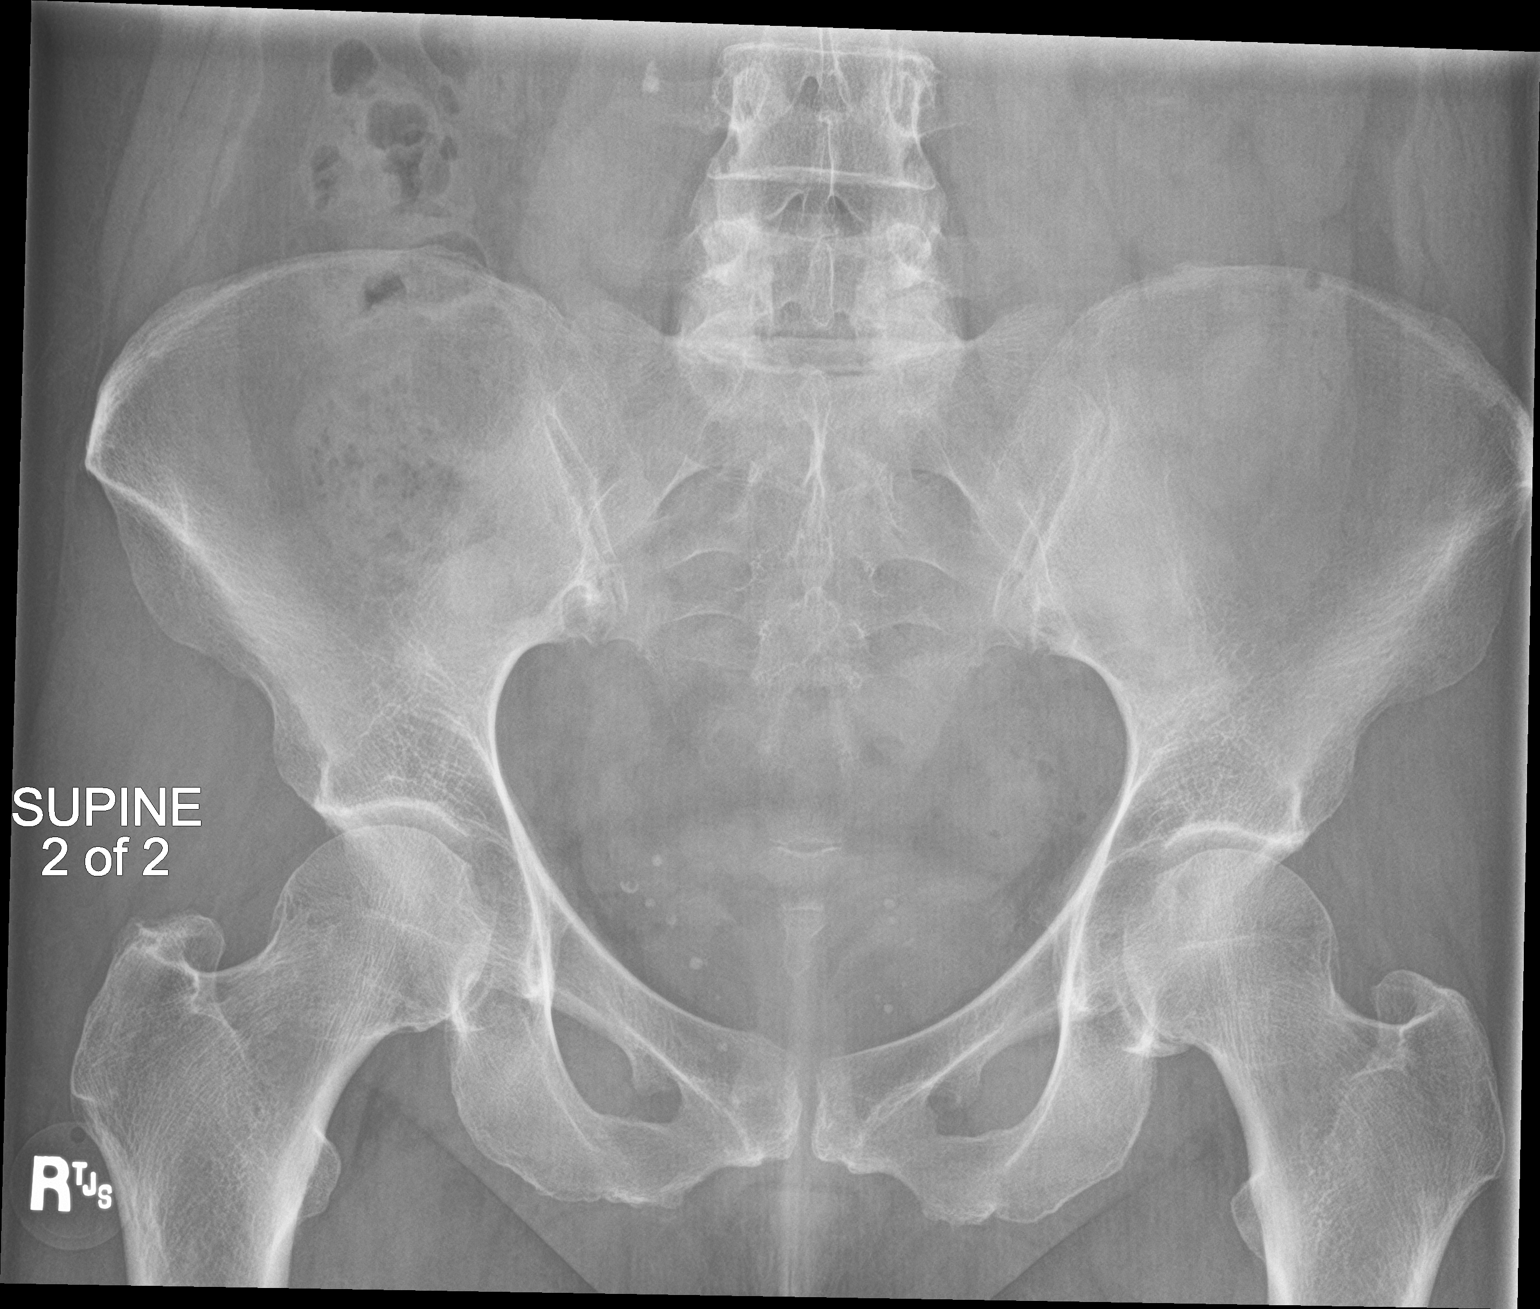

[2 of 2 positions shown; findings below may reference images not displayed]

FINDINGS: Scattered large and small bowel gas is seen. No obstructive changes
are noted. Changes of prior cholecystectomy are seen. There is a
calcification identified over the L4 transverse process on the right
which measures approximately 8 mm in greatest dimension. This is
suspicious for ureteral stone. CT urography may be helpful.
IMPRESSION: Changes suspicious for mid right ureteral stone. CT urography may be
helpful.

## 2022-07-24 ENCOUNTER — Ambulatory Visit (INDEPENDENT_AMBULATORY_CARE_PROVIDER_SITE_OTHER): Payer: BC Managed Care – PPO

## 2022-07-24 ENCOUNTER — Ambulatory Visit
Admission: RE | Admit: 2022-07-24 | Discharge: 2022-07-24 | Disposition: A | Discharge status: Home / Self Care | Payer: BC Managed Care – PPO | Source: Ambulatory Visit | Attending: Physician Assistant | Admitting: Physician Assistant

## 2022-07-24 VITALS — BP 152/126 | HR 87 | Temp 99.0°F | Ht 65.0 in | Wt 170.0 lb

## 2022-07-24 DIAGNOSIS — J209 Acute bronchitis, unspecified: Secondary | ICD-10-CM | POA: Diagnosis not present

## 2022-07-24 DIAGNOSIS — R911 Solitary pulmonary nodule: Secondary | ICD-10-CM

## 2022-07-24 DIAGNOSIS — R9389 Abnormal findings on diagnostic imaging of other specified body structures: Secondary | ICD-10-CM | POA: Diagnosis not present

## 2022-07-24 DIAGNOSIS — R051 Acute cough: Secondary | ICD-10-CM | POA: Diagnosis not present

## 2022-07-24 DIAGNOSIS — Z87891 Personal history of nicotine dependence: Secondary | ICD-10-CM

## 2022-07-24 MED ORDER — AZITHROMYCIN 250 MG PO TABS: 250.0000 mg | ORAL_TABLET | Freq: Every day | ORAL | 0 refills | Status: DC

## 2022-07-24 MED ORDER — ALBUTEROL SULFATE HFA 108 (90 BASE) MCG/ACT IN AERS: 1.0000 | INHALATION_SPRAY | Freq: Four times a day (QID) | RESPIRATORY_TRACT | 0 refills | Status: AC | PRN

## 2022-07-24 MED ORDER — PROMETHAZINE-DM 6.25-15 MG/5ML PO SYRP: 5.0000 mL | ORAL_SOLUTION | Freq: Four times a day (QID) | ORAL | 0 refills | Status: DC | PRN

## 2022-07-24 MED ORDER — PREDNISONE 20 MG PO TABS: 40.0000 mg | ORAL_TABLET | Freq: Every day | ORAL | 0 refills | Status: AC

## 2022-07-24 NOTE — Discharge Instructions (Addendum)
-  You have bronchitis.  I sent antibiotic, corticosteroid, inhaler and cough medicine to the pharmacy. - As we discussed there is a solitary 9 mm pulmonary nodule on your chest x-ray.  This may not be anything serious but given your history of tobacco abuse it is important that you follow-up with your primary care provider as you will need to have a CT scan performed.  Contact your primary care office on Monday. - Go to the emergency department if you have any uncontrolled fever, weakness or increased breathing difficulty.

## 2022-07-24 NOTE — ED Triage Notes (Signed)
Pt c/o cough x2weeks SOB x1day  Pt states that it has gotten worse in the last 24 hours  Pt has a sore throat only when coughing.

## 2022-07-24 NOTE — ED Provider Notes (Signed)
[facility]    CSN: [id number] Arrival date & time: 07/24/22  1225      History   Chief Complaint Chief Complaint  Patient presents with   Cough    HPI Amy Cameron is a 57 y.o. female presenting for 2-week history of cough, congestion, shortness of breath, sinus pressure and headaches.  She reports over the past 24 hours her cough is gotten worse.  She states its mostly dry.  She reports bronchospasms and difficulty breathing at times when having coughing fits.  Denies any associated fever or chest pain.  Patient has no history of asthma or COPD.  She is a former smoker and currently vapes.  Has tried OTC meds for symptoms without relief.  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   Closed fracture of left olecranon process 01/09/2020    Past Surgical History:  Procedure Laterality Date   ABLATION     CHOLECYSTECTOMY     EXTRACORPOREAL SHOCK WAVE LITHOTRIPSY Right 08/10/2018   Procedure: EXTRACORPOREAL SHOCK WAVE LITHOTRIPSY (ESWL);  Surgeon: Sondra Come, MD;  Location: [facility];  Service: Urology;  Laterality: Right;   KIDNEY STONE SURGERY     OTHER SURGICAL HISTORY     both shoulders    OB History     Gravida  1   Para  1   Term  1   Preterm      AB      Living  1      SAB      IAB      Ectopic      Multiple      Live Births  1            Home Medications    Prior to Admission medications   Medication Sig Start Date End Date Taking? Authorizing Provider  acetaminophen (TYLENOL) 500 MG tablet Take by mouth.   Yes [provider]  albuterol (VENTOLIN HFA) 108 (90 Base) MCG/ACT inhaler Inhale 1-2 puffs into the lungs every 6 (six) hours as needed for wheezing or shortness of breath. 07/24/22  Yes Eusebio Friendly B, PA-C   azithromycin (ZITHROMAX) 250 MG tablet Take 1 tablet (250 mg total) by mouth daily. Take first 2 tablets together, then 1 every day until finished. 07/24/22  Yes Shirlee Latch, PA-C  cyanocobalamin 1000 MCG tablet Take by mouth.   Yes [provider]  phentermine (ADIPEX-P) 37.5 MG tablet Take 18.75-37.5 mg by mouth daily. 05/16/20  Yes [provider]  predniSONE (DELTASONE) 20 MG tablet Take 2 tablets (40 mg total) by mouth daily for 5 days. 07/24/22 07/29/22 Yes Shirlee Latch, PA-C  promethazine-dextromethorphan (PROMETHAZINE-DM) 6.25-15 MG/5ML syrup Take 5 mLs by mouth 4 (four) times daily as needed. 07/24/22  Yes Shirlee Latch, PA-C    Family History Family History  Problem Relation Age of Onset   Breast cancer Mother 54       precancerous, removed both breasts   Diabetes Mother    Hypertension Mother    Other Mother        cholesterol   Kidney cancer Father    Diabetes Father    Hypertension Father    Other Father        cholesterol  Social History Social History   Tobacco Use   Smoking status: Former   Smokeless tobacco: Never  Building services engineer Use: Every day  Substance Use Topics   Alcohol use: Not Currently   Drug use: Not Currently     Allergies   Naproxen and Sulfa antibiotics   Review of Systems Review of Systems  Constitutional:  Positive for fatigue. Negative for chills, diaphoresis and fever.  HENT:  Positive for congestion, rhinorrhea and sinus pressure. Negative for ear pain, sinus pain and sore throat.   Respiratory:  Positive for cough and shortness of breath.   Gastrointestinal:  Negative for abdominal pain, nausea and vomiting.  Musculoskeletal:  Negative for arthralgias and myalgias.  Skin:  Negative for rash.  Neurological:  Positive for headaches. Negative for weakness.  Hematological:  Negative for adenopathy.     Physical Exam Triage Vital Signs ED Triage Vitals  Enc Vitals Group     BP 07/24/22 1258 (!)  152/126     Pulse Rate 07/24/22 1258 87     Resp --      Temp 07/24/22 1258 99 F (37.2 C)     Temp Source 07/24/22 1258 Oral     SpO2 07/24/22 1258 98 %     Weight 07/24/22 1256 170 lb (77.1 kg)     Height 07/24/22 1256 5\' 5"  (1.651 m)     Head Circumference --      Peak Flow --      Pain Score 07/24/22 1256 2     Pain Loc --      Pain Edu? --      Excl. in GC? --    No data found.  Updated Vital Signs BP (!) 152/126 (BP Location: Left Arm)   Pulse 87   Temp 99 F (37.2 C) (Oral)   Ht 5\' 5"  (1.651 m)   Wt 170 lb (77.1 kg)   SpO2 98%   BMI 28.29 kg/m      Physical Exam Vitals and nursing note reviewed.  Constitutional:      General: She is not in acute distress.    Appearance: Normal appearance. She is not ill-appearing or toxic-appearing.  HENT:     Head: Normocephalic and atraumatic.     Nose: Congestion present.     Mouth/Throat:     Mouth: Mucous membranes are moist.     Pharynx: Oropharynx is clear.  Eyes:     General: No scleral icterus.       Right eye: No discharge.        Left eye: No discharge.     Conjunctiva/sclera: Conjunctivae normal.  Cardiovascular:     Rate and Rhythm: Normal rate and regular rhythm.     Heart sounds: Normal heart sounds.  Pulmonary:     Effort: Pulmonary effort is normal. No respiratory distress.     Breath sounds: Rhonchi present.  Musculoskeletal:     Cervical back: Neck supple.  Skin:    General: Skin is dry.  Neurological:     General: No focal deficit present.     Mental Status: She is alert. Mental status is at baseline.     Motor: No weakness.     Gait: Gait normal.  Psychiatric:        Mood and Affect: Mood normal.        Behavior: Behavior normal.        Thought Content: Thought content normal.      UC Treatments /  Results  Labs (all labs ordered are listed, but only abnormal results are displayed) Labs Reviewed - No data to display  EKG   Radiology DG Chest 2 View  Result Date:  07/24/2022 CLINICAL DATA:  Shortness of breath. EXAM: CHEST - 2 VIEW COMPARISON:  None Available. FINDINGS: The lungs are clear without focal pneumonia, edema, pneumothorax or pleural effusion. 9 mm pulmonary nodule identified left mid lung. The cardiopericardial silhouette is within normal limits for size. No acute bony abnormality. IMPRESSION: 1. No acute cardiopulmonary findings. 2. 9 mm pulmonary nodule left mid lung. CT chest without contrast recommended to further evaluate. These results will be called to the ordering clinician or representative by the Radiologist Assistant, and communication documented in the PACS or [employer]. Electronically Signed   By: Kennith Center M.D.   On: 07/24/2022 13:34    Procedures Procedures (including critical care time)  Medications Ordered in UC Medications - No data to display  Initial Impression / Assessment and Plan / UC Course  I have reviewed the triage vital signs and the nursing notes.  Pertinent labs & imaging results that were available during my care of the patient were reviewed by me and considered in my medical decision making (see chart for details).   57 year old female presents for 2-week history of cough, congestion, shortness of breath, headaches and sinus pressure.  Recent worsening of cough over the past 24 hours.  Cough is nonproductive.  No fever.  Patient is a former smoker and currently vapes.  She is afebrile.  Overall well-appearing.  On exam is nasal congestion and diffuse rhonchi throughout all lung fields.  Chest x-ray performed shows no pneumonia.  It does show a 9 mm pulmonary nodule.  Patient states she has never been told she has a pulmonary nodule.  She is from [place].  She states that she has had imaging done in [place].  I am unable to find any old chest x-rays or CTs in the system to compare to.  Advised patient it is important that she follow-up with her PCP so that she may obtain a CT scan to further look at this  nodule per the radiologist.  Acute bronchitis.  Treatment with azithromycin, prednisone, albuterol, Promethazine DM.  Encouraged increased rest and fluids.  Reviewed return and ED precautions.  1.  1 acute illness. Acute bronchitis. 2.  1 undiagnosed new problem with uncertain prognosis (pulmonary nodule)  Final Clinical Impressions(s) / UC Diagnoses   Final diagnoses:  Acute bronchitis, unspecified organism  Acute cough  Abnormal chest x-ray  Solitary pulmonary nodule  Former smoker     Discharge Instructions      -You have bronchitis.  I sent antibiotic, corticosteroid, inhaler and cough medicine to the pharmacy. - As we discussed there is a solitary 9 mm pulmonary nodule on your chest x-ray.  This may not be anything serious but given your history of tobacco abuse it is important that you follow-up with your primary care provider as you will need to have a CT scan performed.  Contact your primary care office on Monday. - Go to the emergency department if you have any uncontrolled fever, weakness or increased breathing difficulty.   ED Prescriptions     Medication Sig Dispense Auth. Provider   predniSONE (DELTASONE) 20 MG tablet Take 2 tablets (40 mg total) by mouth daily for 5 days. 10 tablet Eusebio Friendly B, PA-C   albuterol (VENTOLIN HFA) 108 (90 Base) MCG/ACT inhaler Inhale 1-2 puffs into the  lungs every 6 (six) hours as needed for wheezing or shortness of breath. 1 g Shirlee Latch, PA-C   promethazine-dextromethorphan (PROMETHAZINE-DM) 6.25-15 MG/5ML syrup Take 5 mLs by mouth 4 (four) times daily as needed. 118 mL Eusebio Friendly B, PA-C   azithromycin (ZITHROMAX) 250 MG tablet Take 1 tablet (250 mg total) by mouth daily. Take first 2 tablets together, then 1 every day until finished. 6 tablet Gareth Morgan      PDMP not reviewed this encounter.   Shirlee Latch, PA-C 07/24/22 1416

## 2022-07-28 ENCOUNTER — Other Ambulatory Visit: Payer: Self-pay | Admitting: Family Medicine

## 2022-07-28 ENCOUNTER — Ambulatory Visit
Admission: RE | Admit: 2022-07-28 | Discharge: 2022-07-28 | Disposition: A | Discharge status: Home / Self Care | Payer: BC Managed Care – PPO | Source: Ambulatory Visit | Attending: Family Medicine | Admitting: Family Medicine

## 2022-07-28 DIAGNOSIS — R911 Solitary pulmonary nodule: Secondary | ICD-10-CM

## 2022-08-26 ENCOUNTER — Ambulatory Visit
Admission: EM | Admit: 2022-08-26 | Discharge: 2022-08-26 | Disposition: A | Discharge status: Home / Self Care | Payer: BC Managed Care – PPO | Attending: Emergency Medicine | Admitting: Emergency Medicine

## 2022-08-26 DIAGNOSIS — L255 Unspecified contact dermatitis due to plants, except food: Secondary | ICD-10-CM

## 2022-08-26 DIAGNOSIS — R21 Rash and other nonspecific skin eruption: Secondary | ICD-10-CM

## 2022-08-26 DIAGNOSIS — R030 Elevated blood-pressure reading, without diagnosis of hypertension: Secondary | ICD-10-CM

## 2022-08-26 MED ORDER — TRIAMCINOLONE ACETONIDE 0.5 % EX OINT: 1.0000 | TOPICAL_OINTMENT | Freq: Two times a day (BID) | CUTANEOUS | 0 refills | Status: AC

## 2022-08-26 NOTE — ED Triage Notes (Signed)
Pt c/o rash in bilateral legs & arms x5 days, radiating to face today. States was around poison ivy. Has tried benadryl & ice w/o relief.

## 2022-08-26 NOTE — Discharge Instructions (Addendum)
Avoid heat,hot water as it makes rashes worse. Use triamcinolone cream as directed,avoid face. May use daily over the counter allergy med of choice for itching(Zyrtec,Claritin,allegra,benadryl: pick one). May use calamine lotion as label directed. Return as needed.   BP was elevated today in urgent care,recheck with PCP next week

## 2022-08-26 NOTE — ED Provider Notes (Signed)
[facility]    CSN: [id number] Arrival date & time: 08/26/22  1850      History   Chief Complaint Chief Complaint  Patient presents with   Rash    HPI Amy Cameron is a 57 y.o. female.   57 year old female pt, Amy Cameron, presents to urgent for evaluation of rash x 5 days, recent exposure to plants(poison ivy/poison oak).   Pt also reports having recent dental implant surgery.  The history is provided by the patient. No language interpreter was used.    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   Closed fracture of left olecranon process 01/09/2020    Past Surgical History:  Procedure Laterality Date   ABLATION     CHOLECYSTECTOMY     EXTRACORPOREAL SHOCK WAVE LITHOTRIPSY Right 08/10/2018   Procedure: EXTRACORPOREAL SHOCK WAVE LITHOTRIPSY (ESWL);  Surgeon: Sondra Come, MD;  Location: [facility];  Service: Urology;  Laterality: Right;   KIDNEY STONE SURGERY     OTHER SURGICAL HISTORY     both shoulders    OB History     Gravida  1   Para  1   Term  1   Preterm      AB      Living  1      SAB      IAB      Ectopic      Multiple      Live Births  1            Home Medications    Prior to Admission medications   Medication Sig Start Date End Date Taking? Authorizing Provider  acetaminophen (TYLENOL) 500 MG tablet Take by mouth.   Yes [provider]  albuterol (VENTOLIN HFA) 108 (90 Base) MCG/ACT inhaler Inhale 1-2 puffs into the lungs every 6 (six) hours as needed for wheezing or shortness of breath. 07/24/22  Yes Shirlee Latch, PA-C  cyanocobalamin 1000 MCG tablet Take by mouth.   Yes [provider]   phentermine (ADIPEX-P) 37.5 MG tablet Take 18.75-37.5 mg by mouth daily. 05/16/20  Yes [provider]  triamcinolone ointment (KENALOG) 0.5 % Apply 1 Application topically 2 (two) times daily for 7 days. To affected areas, avoid face 08/26/22 09/02/22 Yes Loring Liskey, Para March, NP  azithromycin (ZITHROMAX) 250 MG tablet Take 1 tablet (250 mg total) by mouth daily. Take first 2 tablets together, then 1 every day until finished. 07/24/22   Shirlee Latch, PA-C  promethazine-dextromethorphan (PROMETHAZINE-DM) 6.25-15 MG/5ML syrup Take 5 mLs by mouth 4 (four) times daily as needed. 07/24/22   Shirlee Latch, PA-C    Family History Family History  Problem Relation Age of Onset   Breast cancer Mother 28       precancerous, removed both breasts   Diabetes Mother    Hypertension Mother    Other Mother        cholesterol   Kidney cancer Father    Diabetes Father    Hypertension Father    Other Father        cholesterol    Social History Social History   Tobacco Use  Smoking status: Former   Smokeless tobacco: Never  Vaping Use   Vaping status: Every Day  Substance Use Topics   Alcohol use: Not Currently   Drug use: Not Currently     Allergies   Naproxen and Sulfa antibiotics   Review of Systems Review of Systems  Constitutional:  Negative for fever.  HENT:  Positive for facial swelling. Negative for ear pain.   Skin:  Positive for color change and rash.  All other systems reviewed and are negative.    Physical Exam Triage Vital Signs ED Triage Vitals [08/26/22 1857]  Encounter Vitals Group     BP      Systolic BP Percentile      Diastolic BP Percentile      Pulse      Resp      Temp      Temp src      SpO2      Weight 170 lb (77.1 kg)     Height 5\' 5"  (1.651 m)     Head Circumference      Peak Flow      Pain Score      Pain Loc      Pain Education      Exclude from Growth Chart    No data found.  Updated Vital Signs BP (!) 148/87 (BP Location:  Right Arm)   Pulse 77   Temp 99.5 F (37.5 C) (Oral)   Resp 16   Ht 5\' 5"  (1.651 m)   Wt 170 lb (77.1 kg)   SpO2 95%   BMI 28.29 kg/m   Visual Acuity Right Eye Distance:   Left Eye Distance:   Bilateral Distance:    Right Eye Near:   Left Eye Near:    Bilateral Near:     Physical Exam Vitals and nursing note reviewed.  HENT:     Head: Normocephalic.     Jaw: No trismus.  Cardiovascular:     Rate and Rhythm: Normal rate.  Pulmonary:     Effort: Pulmonary effort is normal.  Skin:    General: Skin is warm.     Capillary Refill: Capillary refill takes less than 2 seconds.     Findings: Rash present. Rash is vesicular.     Comments: Pt has faint areas to extremities with linear vesicular rash noted(mild)  Neurological:     General: No focal deficit present.     Mental Status: She is alert and oriented to person, place, and time.     GCS: GCS eye subscore is 4. GCS verbal subscore is 5. GCS motor subscore is 6.     Cranial Nerves: No cranial nerve deficit.     Sensory: No sensory deficit.  Psychiatric:        Attention and Perception: Attention normal.        Mood and Affect: Mood normal.        Speech: Speech normal.        Behavior: Behavior normal.      UC Treatments / Results  Labs (all labs ordered are listed, but only abnormal results are displayed) Labs Reviewed - No data to display  EKG   Radiology No results found.  Procedures Procedures (including critical care time)  Medications Ordered in UC Medications - No data to display  Initial Impression / Assessment and Plan / UC Course  I have reviewed the triage vital signs and the nursing notes.  Pertinent labs & imaging results that were available  during my care of the patient were reviewed by me and considered in my medical decision making (see chart for details).    Discussed exam findings and plan of care with patient, patient requesting steroid for poison oak/ivy.  Pt has very mild case,  recommended triamcinolone cream, would refrain from steroids due to recent implant surgery.  Patient verbalized understanding this provider.  Ddx: Contact dermatitis,rash,allergies Final Clinical Impressions(s) / UC Diagnoses   Final diagnoses:  Rash and nonspecific skin eruption  Contact dermatitis due to plants, except food, unspecified contact dermatitis type  Elevated blood pressure reading     Discharge Instructions      Avoid heat,hot water as it makes rashes worse. Use triamcinolone cream as directed,avoid face. May use daily over the counter allergy med of choice for itching(Zyrtec,Claritin,allegra,benadryl: pick one). May use calamine lotion as label directed. Return as needed.   BP was elevated today in urgent care,recheck with PCP next week       ED Prescriptions     Medication Sig Dispense Auth. Provider   triamcinolone ointment (KENALOG) 0.5 % Apply 1 Application topically 2 (two) times daily for 7 days. To affected areas, avoid face 14 g Nickole Adamek, Para March, NP      PDMP not reviewed this encounter.   Clancy Gourd, NP 08/26/22 [phone]

## 2022-09-10 ENCOUNTER — Encounter: Payer: Self-pay | Admitting: Emergency Medicine

## 2022-09-10 ENCOUNTER — Ambulatory Visit
Admission: EM | Admit: 2022-09-10 | Discharge: 2022-09-10 | Disposition: A | Discharge status: Home / Self Care | Payer: BC Managed Care – PPO | Attending: Emergency Medicine | Admitting: Emergency Medicine

## 2022-09-10 DIAGNOSIS — R300 Dysuria: Secondary | ICD-10-CM | POA: Insufficient documentation

## 2022-09-10 LAB — URINALYSIS, W/ REFLEX TO CULTURE (INFECTION SUSPECTED)
Bilirubin Urine: NEGATIVE
Glucose, UA: NEGATIVE mg/dL
Hgb urine dipstick: NEGATIVE
Ketones, ur: NEGATIVE mg/dL
Nitrite: NEGATIVE
Protein, ur: NEGATIVE mg/dL
Specific Gravity, Urine: 1.01 (ref 1.005–1.030)
pH: 6.5 (ref 5.0–8.0)

## 2022-09-10 MED ORDER — PHENAZOPYRIDINE HCL 200 MG PO TABS: 200.0000 mg | ORAL_TABLET | Freq: Three times a day (TID) | ORAL | 0 refills | Status: AC

## 2022-09-10 MED ORDER — NITROFURANTOIN MONOHYD MACRO 100 MG PO CAPS: 100.0000 mg | ORAL_CAPSULE | Freq: Two times a day (BID) | ORAL | 0 refills | Status: AC

## 2022-09-10 MED ORDER — FLUCONAZOLE 150 MG PO TABS: 150.0000 mg | ORAL_TABLET | Freq: Every day | ORAL | 0 refills | Status: AC

## 2022-09-10 NOTE — Discharge Instructions (Addendum)
We will call you if the urine culture shows we need to make any changes to your medication.

## 2022-09-10 NOTE — ED Provider Notes (Signed)
[facility]    CSN: [id number] Arrival date & time: 09/10/22  0802      History   Chief Complaint Chief Complaint  Patient presents with   Dysuria    HPI Amy Cameron is a 57 y.o. female onset of bladder pressure and dysuria x 2 days. Denies fever, chills, flank pain. Denies abnormal vaginal discharge. 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   Closed fracture of left olecranon process 01/09/2020    Past Surgical History:  Procedure Laterality Date   ABLATION     CHOLECYSTECTOMY     EXTRACORPOREAL SHOCK WAVE LITHOTRIPSY Right 08/10/2018   Procedure: EXTRACORPOREAL SHOCK WAVE LITHOTRIPSY (ESWL);  Surgeon: Sondra Come, MD;  Location: [facility];  Service: Urology;  Laterality: Right;   KIDNEY STONE SURGERY     OTHER SURGICAL HISTORY     both shoulders    OB History     Gravida  1   Para  1   Term  1   Preterm      AB      Living  1      SAB      IAB      Ectopic      Multiple      Live Births  1            Home Medications    Prior to Admission medications   Medication Sig Start Date End Date Taking? Authorizing Provider  fluconazole (DIFLUCAN) 150 MG tablet Take 1 tablet (150 mg total) by mouth daily. 09/10/22  Yes Rodriguez-Southworth, Nettie Elm, PA-C  nitrofurantoin, macrocrystal-monohydrate, (MACROBID) 100 MG capsule Take 1 capsule (100 mg total) by mouth 2 (two) times daily. 09/10/22  Yes Rodriguez-Southworth, Nettie Elm, PA-C  phenazopyridine (PYRIDIUM) 200 MG tablet Take 1 tablet (200 mg total) by mouth 3 (three) times daily. 09/10/22  Yes Rodriguez-Southworth, Nettie Elm, PA-C  acetaminophen (TYLENOL) 500 MG tablet  Take by mouth.    [provider]  albuterol (VENTOLIN HFA) 108 (90 Base) MCG/ACT inhaler Inhale 1-2 puffs into the lungs every 6 (six) hours as needed for wheezing or shortness of breath. 07/24/22   Shirlee Latch, PA-C  cyanocobalamin 1000 MCG tablet Take by mouth.    [provider]    Family History Family History  Problem Relation Age of Onset   Breast cancer Mother 53       precancerous, removed both breasts   Diabetes Mother    Hypertension Mother    Other Mother        cholesterol   Kidney cancer Father    Diabetes Father    Hypertension Father    Other Father        cholesterol    Social History Social History   Tobacco Use   Smoking status: Former   Smokeless tobacco: Never  Vaping Use   Vaping status: Every Day  Substance Use Topics   Alcohol use: Not Currently   Drug use: Not Currently     Allergies   Naproxen and Sulfa antibiotics   Review of Systems Review of Systems As noted per HPI  Physical Exam Triage Vital Signs ED Triage Vitals  Encounter Vitals Group     BP 09/10/22 0814 116/79     Systolic BP Percentile --      Diastolic BP Percentile --      Pulse Rate 09/10/22 0814 80     Resp 09/10/22 0814 14     Temp 09/10/22 0814 99.1 F (37.3 C)     Temp Source 09/10/22 0814 Oral     SpO2 09/10/22 0814 95 %     Weight 09/10/22 0812 165 lb (74.8 kg)     Height 09/10/22 0812 5\' 5"  (1.651 m)     Head Circumference --      Peak Flow --      Pain Score 09/10/22 0812 3     Pain Loc --      Pain Education --      Exclude from Growth Chart --    No data found.  Updated Vital Signs BP 116/79 (BP Location: Right Arm)   Pulse 80   Temp 99.1 F (37.3 C) (Oral)   Resp 14   Ht 5\' 5"  (1.651 m)   Wt 165 lb (74.8 kg)   SpO2 95%   BMI 27.46 kg/m   Visual Acuity Right Eye Distance:   Left Eye Distance:   Bilateral Distance:    Right Eye Near:   Left Eye Near:    Bilateral Near:     Physical Exam Physical Exam Vitals  and nursing note reviewed.  Constitutional:      General: She is not in acute distress.    Appearance: She is not toxic-appearing.  HENT:     Head: Normocephalic.     Right Ear: External ear normal.     Left Ear: External ear normal.  Eyes:     General: No scleral icterus.    Conjunctiva/sclera: Conjunctivae normal.  Pulmonary:     Effort: Pulmonary effort is normal.  Abdominal:     General: Bowel sounds are normal.     Palpations: Abdomen is soft. There is no mass.     Tenderness: There is no guarding or rebound.     Comments: - CVA tenderness   Musculoskeletal:        General: Normal range of motion.     Cervical back: Neck supple.     Skin:    General: Skin is warm and dry.     Findings: No rash.  Neurological:     Mental Status: She is alert and oriented to person, place, and time.     Gait: Gait normal.  Psychiatric:        Mood and Affect: Mood normal.        Behavior: Behavior normal.        Thought Content: Thought content normal.        Judgment: Judgment normal.    UC Treatments / Results  Labs (all labs ordered are listed, but only abnormal results are displayed) Labs Reviewed  URINALYSIS, W/ REFLEX TO CULTURE (INFECTION SUSPECTED) - Abnormal; Notable for the following components:      Result Value   Leukocytes,Ua SMALL (*)    Bacteria, UA FEW (*)    All other components within normal limits  URINE CULTURE    EKG   Radiology No results found.  Procedures Procedures (including critical care time)  Medications Ordered in UC Medications - No data to display  Initial Impression / Assessment and Plan / UC Course  I have reviewed the  triage vital signs and the nursing notes.  Pertinent labs  results that were available during my care of the patient were reviewed by me and considered in my medical decision making (see chart for details).  URI  I placed her on Macrobid, pyridium and diflucan as noted Urine was sent out for a culture See  instructions.    Final Clinical Impressions(s) / UC Diagnoses   Final diagnoses:  Dysuria     Discharge Instructions      We will call you if the urine culture shows we need to make any changes to your medication.      ED Prescriptions     Medication Sig Dispense Auth. Provider   nitrofurantoin, macrocrystal-monohydrate, (MACROBID) 100 MG capsule Take 1 capsule (100 mg total) by mouth 2 (two) times daily. 10 capsule Rodriguez-Southworth, Nettie Elm, PA-C   phenazopyridine (PYRIDIUM) 200 MG tablet Take 1 tablet (200 mg total) by mouth 3 (three) times daily. 6 tablet Rodriguez-Southworth, Nettie Elm, PA-C   fluconazole (DIFLUCAN) 150 MG tablet Take 1 tablet (150 mg total) by mouth daily. 1 tablet Rodriguez-Southworth, Nettie Elm, PA-C      PDMP not reviewed this encounter.   Garey Ham, PA-C 09/10/22 [phone]

## 2022-09-10 NOTE — ED Triage Notes (Signed)
Patient c/o bladder pressure and burning when urinating that started on Wed.  Patient has taken OTC AZO

## 2022-09-14 ENCOUNTER — Telehealth (HOSPITAL_COMMUNITY): Payer: Self-pay | Admitting: Emergency Medicine

## 2022-09-14 MED ORDER — CEPHALEXIN 500 MG PO CAPS: 500.0000 mg | ORAL_CAPSULE | Freq: Two times a day (BID) | ORAL | 0 refills | Status: AC

## 2022-09-14 NOTE — Telephone Encounter (Signed)
Change abx for urine culture

## 2023-07-27 ENCOUNTER — Ambulatory Visit
Admission: EM | Admit: 2023-07-27 | Discharge: 2023-07-27 | Disposition: A | Discharge status: Home / Self Care | Attending: Family Medicine | Admitting: Family Medicine

## 2023-07-27 DIAGNOSIS — H66001 Acute suppurative otitis media without spontaneous rupture of ear drum, right ear: Secondary | ICD-10-CM

## 2023-07-27 MED ORDER — AMOXICILLIN-POT CLAVULANATE 875-125 MG PO TABS: 1.0000 | ORAL_TABLET | Freq: Two times a day (BID) | ORAL | 0 refills | Status: AC

## 2023-07-27 NOTE — ED Provider Notes (Signed)
 [facility]    CSN: [id number] Arrival date & time: 07/27/23  1054      History   Chief Complaint Chief Complaint  Patient presents with   Otalgia   Ear Fullness    HPI Amy Cameron is a 58 y.o. female.   HPI   Amy Cameron presents for right ear pain for the past couple days with associated fullness and hearing loss.  No fever. Used Q-tips to dry her ears after showering.   Amy Cameron has otherwise been well and has no other concerns.      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   Closed fracture of left olecranon process 01/09/2020    Past Surgical History:  Procedure Laterality Date   ABLATION     CHOLECYSTECTOMY     EXTRACORPOREAL SHOCK WAVE LITHOTRIPSY Right 08/10/2018   Procedure: EXTRACORPOREAL SHOCK WAVE LITHOTRIPSY (ESWL);  Surgeon: Francisca Redell BROCKS, MD;  Location: [facility];  Service: Urology;  Laterality: Right;   KIDNEY STONE SURGERY     OTHER SURGICAL HISTORY     both shoulders    OB History     Gravida  1   Para  1   Term  1   Preterm      AB      Living  1      SAB      IAB      Ectopic      Multiple      Live Births  1            Home Medications    Prior to Admission medications   Medication Sig Start Date End Date Taking? Authorizing Provider  amoxicillin-clavulanate (AUGMENTIN) 875-125 MG tablet Take 1 tablet by mouth every 12 (twelve) hours. 07/27/23  Yes Maxden Naji, DO  acetaminophen  (TYLENOL ) 500 MG tablet Take by mouth.    [provider]  albuterol  (VENTOLIN  HFA) 108 (90 Base) MCG/ACT inhaler Inhale 1-2 puffs into the lungs every 6 (six) hours as needed for wheezing or shortness of breath.  07/24/22   Arvis Jolan NOVAK, PA-C  cyanocobalamin 1000 MCG tablet Take by mouth.    [provider]  fluconazole  (DIFLUCAN ) 150 MG tablet Take 1 tablet (150 mg total) by mouth daily. 09/10/22   Rodriguez-Southworth, Sylvia, PA-C  nitrofurantoin , macrocrystal-monohydrate, (MACROBID ) 100 MG capsule Take 1 capsule (100 mg total) by mouth 2 (two) times daily. 09/10/22   Rodriguez-Southworth, Sylvia, PA-C  phenazopyridine  (PYRIDIUM ) 200 MG tablet Take 1 tablet (200 mg total) by mouth 3 (three) times daily. 09/10/22   Rodriguez-Southworth, Kyra, PA-C    Family History Family History  Problem Relation Age of Onset   Breast cancer Mother 41       precancerous, removed both breasts   Diabetes Mother    Hypertension Mother    Other Mother        cholesterol   Kidney cancer Father    Diabetes Father    Hypertension Father    Other Father        cholesterol    Social History Social History   Tobacco Use   Smoking status: Former   Smokeless tobacco: Never  Vaping Use   Vaping status: Every Day  Substance Use Topics   Alcohol use: Not Currently   Drug use: Not Currently     Allergies   Naproxen and Sulfa antibiotics   Review of Systems Review of Systems: :negative unless otherwise stated in HPI.      Physical Exam Triage Vital Signs ED Triage Vitals  Encounter Vitals Group     BP 07/27/23 1113 119/79     Girls Systolic BP Percentile --      Girls Diastolic BP Percentile --      Boys Systolic BP Percentile --      Boys Diastolic BP Percentile --      Pulse Rate 07/27/23 1113 72     Resp 07/27/23 1113 16     Temp 07/27/23 1113 98.2 F (36.8 C)     Temp Source 07/27/23 1113 Oral     SpO2 07/27/23 1113 94 %     Weight --      Height --      Head Circumference --      Peak Flow --      Pain Score 07/27/23 1114 1     Pain Loc --      Pain Education --      Exclude from Growth Chart --    No data found.  Updated Vital Signs BP 119/79 (BP Location: Right Arm)    Pulse 72   Temp 98.2 F (36.8 C) (Oral)   Resp 16   SpO2 94%   Visual Acuity Right Eye Distance:   Left Eye Distance:   Bilateral Distance:    Right Eye Near:   Left Eye Near:    Bilateral Near:     Physical Exam GEN:     alert, non-toxic appearing female in no distress    HENT:  mucus membranes moist,  no nasal discharge, right TM opaque and bulging, left TM normal, normal external auditory canals bilaterally, non-tender tragus EYES:   no scleral injection NECK:  normal ROM, + tender anterior lymphadenopathy RESP:  no increased work of breathing CVS:   regular rate  Skin:   warm and dry    UC Treatments / Results  Labs (all labs ordered are listed, but only abnormal results are displayed) Labs Reviewed - No data to display  EKG   Radiology No results found.  Procedures Procedures (including critical care time)  Medications Ordered in UC Medications - No data to display  Initial Impression / Assessment and Plan / UC Course  I have reviewed the triage vital signs and the nursing notes.  Pertinent labs & imaging results that were available during my care of the patient were reviewed by me and considered in my medical decision making (see chart for details).       Acute Otitis media Overall patient is well-appearing, well-hydrated and without respiratory distress. She is afebrile. Treat with amoxicillin-clavulanic acid for 7 days.  Tylenol /Motrin's as needed for fever or discomfort.  Stressed importance of hydration.    Discussed MDM, treatment plan and plan for follow-up with patient who agrees with plan.      Final Clinical Impressions(s) / UC Diagnoses   Final diagnoses:  Non-recurrent acute suppurative otitis media of right ear without spontaneous rupture of tympanic membrane     Discharge Instructions      Stop by the pharmacy to pick up your prescriptions.  Follow up with your primary care provider or return to the urgent care, if not  improving.       ED Prescriptions     Medication Sig Dispense Auth. Provider   amoxicillin-clavulanate (AUGMENTIN) 875-125 MG tablet Take 1 tablet by mouth every 12 (twelve) hours. 14 tablet Ziare Cryder, DO      PDMP not reviewed this encounter.   Kriste Berth, DO 07/27/23 1143

## 2023-07-27 NOTE — Discharge Instructions (Addendum)
 Stop by the pharmacy to pick up your prescriptions.  Follow up with your primary care provider or return to the urgent care, if not improving.

## 2023-07-27 NOTE — ED Triage Notes (Signed)
 Right ear pain that goes down to her neck, ear fullness, decreased hearing x 2 days.

## 2023-08-01 ENCOUNTER — Other Ambulatory Visit: Payer: Self-pay | Admitting: Family Medicine

## 2023-08-01 DIAGNOSIS — R911 Solitary pulmonary nodule: Secondary | ICD-10-CM

## 2023-08-02 ENCOUNTER — Other Ambulatory Visit
Admission: RE | Admit: 2023-08-02 | Discharge: 2023-08-02 | Disposition: A | Discharge status: Home / Self Care | Attending: Family Medicine | Admitting: Family Medicine

## 2023-08-02 DIAGNOSIS — R7309 Other abnormal glucose: Secondary | ICD-10-CM | POA: Insufficient documentation

## 2023-08-02 DIAGNOSIS — Z1329 Encounter for screening for other suspected endocrine disorder: Secondary | ICD-10-CM | POA: Insufficient documentation

## 2023-08-02 DIAGNOSIS — Z13228 Encounter for screening for other metabolic disorders: Secondary | ICD-10-CM | POA: Insufficient documentation

## 2023-08-02 DIAGNOSIS — Z13220 Encounter for screening for lipoid disorders: Secondary | ICD-10-CM | POA: Insufficient documentation

## 2023-08-02 DIAGNOSIS — Z130 Encounter for screening for diseases of the blood and blood-forming organs and certain disorders involving the immune mechanism: Secondary | ICD-10-CM | POA: Diagnosis present

## 2023-08-02 LAB — LIPID PANEL
Cholesterol: 190 mg/dL (ref 0–200)
HDL: 38 mg/dL — ABNORMAL LOW (ref >40)
LDL Cholesterol: 125 mg/dL — ABNORMAL HIGH (ref 0–99)
Total CHOL/HDL Ratio: 5 ratio
Triglycerides: 133 mg/dL (ref <150)
VLDL: 27 mg/dL (ref 0–40)

## 2023-08-02 LAB — COMPREHENSIVE METABOLIC PANEL WITH GFR
ALT: 13 U/L (ref 0–44)
AST: 16 U/L (ref 15–41)
Albumin: 4.2 g/dL (ref 3.5–5.0)
Alkaline Phosphatase: 107 U/L (ref 38–126)
Anion gap: 12 (ref 5–15)
BUN: 12 mg/dL (ref 6–20)
CO2: 20 mmol/L — ABNORMAL LOW (ref 22–32)
Calcium: 8.7 mg/dL — ABNORMAL LOW (ref 8.9–10.3)
Chloride: 104 mmol/L (ref 98–111)
Creatinine, Ser: 0.87 mg/dL (ref 0.44–1.00)
GFR, Estimated: >60 mL/min (ref >60)
Glucose, Bld: 103 mg/dL — ABNORMAL HIGH (ref 70–99)
Potassium: 3.8 mmol/L (ref 3.5–5.1)
Sodium: 136 mmol/L (ref 135–145)
Total Bilirubin: 0.4 mg/dL (ref 0.0–1.2)
Total Protein: 7.4 g/dL (ref 6.5–8.1)

## 2023-08-02 LAB — CBC
HCT: 47.7 % — ABNORMAL HIGH (ref 36.0–46.0)
Hemoglobin: 16 g/dL — ABNORMAL HIGH (ref 12.0–15.0)
MCH: 28.3 pg (ref 26.0–34.0)
MCHC: 33.5 g/dL (ref 30.0–36.0)
MCV: 84.4 fL (ref 80.0–100.0)
Platelets: 207 10*3/uL (ref 150–400)
RBC: 5.65 MIL/uL — ABNORMAL HIGH (ref 3.87–5.11)
RDW: 14.5 % (ref 11.5–15.5)
WBC: 9.8 10*3/uL (ref 4.0–10.5)
nRBC: 0 % (ref 0.0–0.2)

## 2023-08-02 LAB — TSH: TSH: 1.707 u[IU]/mL (ref 0.350–4.500)

## 2023-08-02 LAB — HEMOGLOBIN A1C
Hgb A1c MFr Bld: 5.6 % (ref 4.8–5.6)
Mean Plasma Glucose: 114.02 mg/dL

## 2023-08-04 ENCOUNTER — Inpatient Hospital Stay: Admission: RE | Admit: 2023-08-04 | Source: Ambulatory Visit

## 2023-08-10 ENCOUNTER — Encounter: Payer: Self-pay | Admitting: Family Medicine

## 2023-08-17 ENCOUNTER — Other Ambulatory Visit: Payer: Self-pay | Admitting: Family Medicine

## 2023-08-17 ENCOUNTER — Encounter: Payer: Self-pay | Admitting: Family Medicine

## 2023-08-17 DIAGNOSIS — R911 Solitary pulmonary nodule: Secondary | ICD-10-CM

## 2023-08-19 ENCOUNTER — Ambulatory Visit
Admission: RE | Admit: 2023-08-19 | Discharge: 2023-08-19 | Disposition: A | Discharge status: Home / Self Care | Source: Ambulatory Visit | Attending: Family Medicine | Admitting: Family Medicine

## 2023-08-19 DIAGNOSIS — R911 Solitary pulmonary nodule: Secondary | ICD-10-CM
